# Patient Record
Sex: Male | Born: 1940 | Race: White | Hispanic: No | Smoking: Former smoker
Health system: Southern US, Community
[De-identification: ages and names within clinical notes are randomized; demographics above are authoritative.]

## PROBLEM LIST (undated history)

## (undated) DIAGNOSIS — I10 Essential (primary) hypertension: Secondary | ICD-10-CM

## (undated) DIAGNOSIS — IMO0002 Reserved for concepts with insufficient information to code with codable children: Secondary | ICD-10-CM

## (undated) DIAGNOSIS — E119 Type 2 diabetes mellitus without complications: Secondary | ICD-10-CM

## (undated) DIAGNOSIS — K7460 Unspecified cirrhosis of liver: Secondary | ICD-10-CM

## (undated) DIAGNOSIS — K219 Gastro-esophageal reflux disease without esophagitis: Secondary | ICD-10-CM

## (undated) DIAGNOSIS — I2510 Atherosclerotic heart disease of native coronary artery without angina pectoris: Secondary | ICD-10-CM

## (undated) DIAGNOSIS — I480 Paroxysmal atrial fibrillation: Secondary | ICD-10-CM

## (undated) DIAGNOSIS — H8109 Meniere's disease, unspecified ear: Secondary | ICD-10-CM

## (undated) DIAGNOSIS — E785 Hyperlipidemia, unspecified: Secondary | ICD-10-CM

## (undated) HISTORY — DX: Meniere's disease, unspecified ear: H81.09

## (undated) HISTORY — DX: Atherosclerotic heart disease of native coronary artery without angina pectoris: I25.10

## (undated) HISTORY — DX: Paroxysmal atrial fibrillation: I48.0

## (undated) HISTORY — DX: Hyperlipidemia, unspecified: E78.5

## (undated) HISTORY — PX: OTHER SURGICAL HISTORY: SHX169

## (undated) HISTORY — DX: Gastro-esophageal reflux disease without esophagitis: K21.9

## (undated) HISTORY — DX: Essential (primary) hypertension: I10

## (undated) HISTORY — DX: Reserved for concepts with insufficient information to code with codable children: IMO0002

## (undated) HISTORY — DX: Type 2 diabetes mellitus without complications: E11.9

## (undated) HISTORY — DX: Unspecified cirrhosis of liver: K74.60

---

## 1999-04-29 ENCOUNTER — Ambulatory Visit (HOSPITAL_COMMUNITY): Admission: RE | Admit: 1999-04-29 | Discharge: 1999-04-29 | Payer: Self-pay | Admitting: Family Medicine

## 2003-07-31 ENCOUNTER — Encounter: Admission: RE | Admit: 2003-07-31 | Discharge: 2003-07-31 | Payer: Self-pay | Admitting: Family Medicine

## 2003-07-31 ENCOUNTER — Encounter: Payer: Self-pay | Admitting: Family Medicine

## 2005-02-24 ENCOUNTER — Ambulatory Visit (HOSPITAL_COMMUNITY): Admission: RE | Admit: 2005-02-24 | Discharge: 2005-02-24 | Payer: Self-pay | Admitting: Family Medicine

## 2005-11-10 ENCOUNTER — Encounter (INDEPENDENT_AMBULATORY_CARE_PROVIDER_SITE_OTHER): Payer: Self-pay | Admitting: *Deleted

## 2005-11-10 ENCOUNTER — Ambulatory Visit (HOSPITAL_COMMUNITY): Admission: RE | Admit: 2005-11-10 | Discharge: 2005-11-10 | Payer: Self-pay | Admitting: General Surgery

## 2007-02-24 ENCOUNTER — Ambulatory Visit: Payer: Self-pay | Admitting: Family Medicine

## 2007-03-03 ENCOUNTER — Ambulatory Visit: Payer: Self-pay | Admitting: Family Medicine

## 2007-05-03 ENCOUNTER — Ambulatory Visit: Payer: Self-pay | Admitting: Family Medicine

## 2009-12-07 HISTORY — PX: CORONARY ARTERY BYPASS GRAFT: SHX141

## 2009-12-21 ENCOUNTER — Ambulatory Visit: Payer: Self-pay | Admitting: Cardiology

## 2009-12-21 ENCOUNTER — Encounter: Payer: Self-pay | Admitting: Thoracic Surgery (Cardiothoracic Vascular Surgery)

## 2009-12-21 ENCOUNTER — Encounter: Payer: Self-pay | Admitting: Cardiology

## 2009-12-21 ENCOUNTER — Inpatient Hospital Stay (HOSPITAL_COMMUNITY): Admission: EM | Admit: 2009-12-21 | Discharge: 2009-12-31 | Payer: Self-pay | Admitting: Emergency Medicine

## 2009-12-22 ENCOUNTER — Encounter: Payer: Self-pay | Admitting: Internal Medicine

## 2009-12-23 ENCOUNTER — Encounter (INDEPENDENT_AMBULATORY_CARE_PROVIDER_SITE_OTHER): Payer: Self-pay | Admitting: Internal Medicine

## 2009-12-23 ENCOUNTER — Encounter: Payer: Self-pay | Admitting: Thoracic Surgery (Cardiothoracic Vascular Surgery)

## 2009-12-23 ENCOUNTER — Ambulatory Visit: Payer: Self-pay | Admitting: Thoracic Surgery (Cardiothoracic Vascular Surgery)

## 2009-12-23 ENCOUNTER — Encounter: Payer: Self-pay | Admitting: Cardiology

## 2009-12-24 ENCOUNTER — Encounter: Payer: Self-pay | Admitting: Cardiology

## 2009-12-24 ENCOUNTER — Encounter: Payer: Self-pay | Admitting: Thoracic Surgery (Cardiothoracic Vascular Surgery)

## 2009-12-25 ENCOUNTER — Encounter: Payer: Self-pay | Admitting: Cardiology

## 2010-01-20 ENCOUNTER — Ambulatory Visit: Payer: Self-pay | Admitting: Thoracic Surgery (Cardiothoracic Vascular Surgery)

## 2010-01-20 ENCOUNTER — Encounter
Admission: RE | Admit: 2010-01-20 | Discharge: 2010-01-20 | Payer: Self-pay | Admitting: Thoracic Surgery (Cardiothoracic Vascular Surgery)

## 2010-01-28 ENCOUNTER — Telehealth (INDEPENDENT_AMBULATORY_CARE_PROVIDER_SITE_OTHER): Payer: Self-pay | Admitting: *Deleted

## 2010-02-10 ENCOUNTER — Encounter
Admission: RE | Admit: 2010-02-10 | Discharge: 2010-02-10 | Payer: Self-pay | Admitting: Thoracic Surgery (Cardiothoracic Vascular Surgery)

## 2010-02-10 ENCOUNTER — Encounter: Payer: Self-pay | Admitting: Cardiology

## 2010-02-10 ENCOUNTER — Ambulatory Visit: Payer: Self-pay | Admitting: Thoracic Surgery (Cardiothoracic Vascular Surgery)

## 2010-02-14 DIAGNOSIS — K7460 Unspecified cirrhosis of liver: Secondary | ICD-10-CM | POA: Insufficient documentation

## 2010-02-14 DIAGNOSIS — I10 Essential (primary) hypertension: Secondary | ICD-10-CM

## 2010-02-14 DIAGNOSIS — E119 Type 2 diabetes mellitus without complications: Secondary | ICD-10-CM

## 2010-02-14 DIAGNOSIS — I2510 Atherosclerotic heart disease of native coronary artery without angina pectoris: Secondary | ICD-10-CM | POA: Insufficient documentation

## 2010-02-17 ENCOUNTER — Ambulatory Visit: Payer: Self-pay | Admitting: Cardiology

## 2010-02-17 DIAGNOSIS — I4891 Unspecified atrial fibrillation: Secondary | ICD-10-CM | POA: Insufficient documentation

## 2010-02-17 DIAGNOSIS — D509 Iron deficiency anemia, unspecified: Secondary | ICD-10-CM

## 2010-03-11 ENCOUNTER — Encounter (INDEPENDENT_AMBULATORY_CARE_PROVIDER_SITE_OTHER): Payer: Self-pay | Admitting: *Deleted

## 2010-03-28 ENCOUNTER — Encounter: Payer: Self-pay | Admitting: Cardiology

## 2010-04-02 ENCOUNTER — Encounter: Payer: Self-pay | Admitting: Cardiology

## 2010-10-01 ENCOUNTER — Encounter: Payer: Self-pay | Admitting: Cardiology

## 2010-10-13 ENCOUNTER — Ambulatory Visit: Payer: Self-pay | Admitting: Cardiology

## 2010-10-27 ENCOUNTER — Telehealth (INDEPENDENT_AMBULATORY_CARE_PROVIDER_SITE_OTHER): Payer: Self-pay | Admitting: *Deleted

## 2010-11-06 ENCOUNTER — Encounter (INDEPENDENT_AMBULATORY_CARE_PROVIDER_SITE_OTHER): Payer: Self-pay | Admitting: *Deleted

## 2010-12-22 ENCOUNTER — Ambulatory Visit
Admission: RE | Admit: 2010-12-22 | Discharge: 2010-12-22 | Payer: Self-pay | Source: Home / Self Care | Attending: Gastroenterology | Admitting: Gastroenterology

## 2010-12-22 ENCOUNTER — Other Ambulatory Visit: Payer: Self-pay | Admitting: Gastroenterology

## 2010-12-22 LAB — CBC WITH DIFFERENTIAL/PLATELET
Basophils Absolute: 0 10*3/uL (ref 0.0–0.1)
Basophils Relative: 0.2 % (ref 0.0–3.0)
Eosinophils Absolute: 0.1 10*3/uL (ref 0.0–0.7)
Eosinophils Relative: 2.1 % (ref 0.0–5.0)
HCT: 40.4 % (ref 39.0–52.0)
Hemoglobin: 13.8 g/dL (ref 13.0–17.0)
Lymphocytes Relative: 35 % (ref 12.0–46.0)
Lymphs Abs: 1.9 10*3/uL (ref 0.7–4.0)
MCHC: 34.2 g/dL (ref 30.0–36.0)
MCV: 98.7 fl (ref 78.0–100.0)
Monocytes Absolute: 0.4 10*3/uL (ref 0.1–1.0)
Monocytes Relative: 7 % (ref 3.0–12.0)
Neutro Abs: 3 10*3/uL (ref 1.4–7.7)
Neutrophils Relative %: 55.7 % (ref 43.0–77.0)
Platelets: 154 10*3/uL (ref 150.0–400.0)
RBC: 4.1 Mil/uL — ABNORMAL LOW (ref 4.22–5.81)
RDW: 14.2 % (ref 11.5–14.6)
WBC: 5.4 10*3/uL (ref 4.5–10.5)

## 2010-12-22 LAB — COMPREHENSIVE METABOLIC PANEL
ALT: 11 U/L (ref 0–53)
AST: 21 U/L (ref 0–37)
Albumin: 3.7 g/dL (ref 3.5–5.2)
Alkaline Phosphatase: 63 U/L (ref 39–117)
BUN: 17 mg/dL (ref 6–23)
CO2: 26 mEq/L (ref 19–32)
Calcium: 9.5 mg/dL (ref 8.4–10.5)
Chloride: 104 mEq/L (ref 96–112)
Creatinine, Ser: 0.8 mg/dL (ref 0.4–1.5)
GFR: 109.01 mL/min (ref >60.00)
Glucose, Bld: 229 mg/dL — ABNORMAL HIGH (ref 70–99)
Potassium: 4.8 mEq/L (ref 3.5–5.1)
Sodium: 139 mEq/L (ref 135–145)
Total Bilirubin: 1.6 mg/dL — ABNORMAL HIGH (ref 0.3–1.2)
Total Protein: 6.5 g/dL (ref 6.0–8.3)

## 2010-12-22 LAB — APTT: aPTT: 30.8 s — ABNORMAL HIGH (ref 21.7–28.8)

## 2010-12-22 LAB — PROTIME-INR
INR: 1 ratio (ref 0.8–1.0)
Prothrombin Time: 11.3 s (ref 10.2–12.4)

## 2011-01-08 NOTE — Letter (Signed)
Summary: Generic Engineer, agricultural at [facility] [address]   [place], [place] [address]   Phone: [phone]  Fax: [phone]        March 11, 2010 MRN: [id number]    [facility] [address] [place], [place]  [address]    Dear Anthony Page,  We ordered lab work following your office visit on March 14th. It does not appear this has been done yet. Please, take the enclosed order to the [facility] as soon as possible to have your lab work done. Do not eat or drink after midnight.  If your primary MD completed this lab work for you or you do not plan to have it done, please contact our office so that we can properly document this in your chart.     Sincerely,  Cyril Loosen, RN, BSN  This letter has been electronically signed by your physician.

## 2011-01-08 NOTE — Letter (Signed)
Summary: New Patient letter  [facility]  [address] [place], [place] [address]   Phone: [phone]  Fax: [phone]       11/06/2010 MRN: [id number]  [facility] [address] [place], [place]  [address]  Dear Mr. Weichel,  Welcome to the Gastroenterology Division at [facility].    You are scheduled to see Dr. Arlyce Dice on 12/22/2010 at 10:00AM on the 3rd floor at [facility], [address]  We ask that you try to arrive at our office 15 minutes prior to your appointment time to allow for check-in.  We would like you to complete the enclosed self-administered evaluation form prior to your visit and bring it with you on the day of your appointment.  We will review it with you.  Also, please bring a complete list of all your medications or, if you prefer, bring the medication bottles and we will list them.  Please bring your insurance card so that we may make a copy of it.  If your insurance requires a referral to see a specialist, please bring your referral form from your primary care physician.  Co-payments are due at the time of your visit and may be paid by cash, check or credit card.     Your office visit will consist of a consult with your physician (includes a physical exam), any laboratory testing he/she may order, scheduling of any necessary diagnostic testing (e.g. x-ray, ultrasound, CT-scan), and scheduling of a procedure (e.g. Endoscopy, Colonoscopy) if required.  Please allow enough time on your schedule to allow for any/all of these possibilities.    If you cannot keep your appointment, please call [phone] to cancel or reschedule prior to your appointment date.  This allows [place] the opportunity to schedule an appointment for another patient in need of care.  If you do not cancel or reschedule by 5 p.m. the business day prior to your appointment date, you will be charged a $50.00 late cancellation/no-show fee.    Thank you for choosing [facility] for your medical needs.  We appreciate the opportunity to care for you.  Please visit [place] at our website  to learn more about our practice.                     Sincerely,                                                             The Gastroenterology Division

## 2011-01-08 NOTE — Letter (Signed)
Summary: Discharge Summary  Discharge Summary   Imported By: Dorise Hiss 02/14/2010 15:28:58  _____________________________________________________________________  External Attachment:    Type:   Image     Comment:   External Document

## 2011-01-08 NOTE — Assessment & Plan Note (Signed)
Summary: POST CABG PER DR. HENDRICKSON-JM   Visit Type:  hospital follow-up Primary Provider:  Nyland  CC:  hospital follow-up visit.  History of Present Illness: The patient is a 70 year old male with a recent history of coronary bypass grafting secondary to severe triple vessel coronary artery disease. The patient had postoperative atrial fibrillation and anemia. He also has a history of cirrhosis and is status post portacaval shunt in 1990 doesn't history of diabetes mellitus and dyslipidemia. The patient underwent coronary bypass grafting x4 with a left internal mammary artery to the LAD, a saphenous vein graft to the first diagonal, saphenous vein graft to obtuse marginal and a saphenous vein graft to the distal right coronary artery.  The patient presents for postoperative followup. He has already seen Dr. Dorris Fetch. He denies any sexual chest pain shortness of breath orthopnea PND. He has no palpitations or syncope. He does report some lower extremity edema.the patient reports an atypical chest pain as well in the right arm. He reports no dizziness or weakness.  Preventive Screening-Counseling & Management  Alcohol-Tobacco     Smoking Status: quit     Year Quit: 1990  Current Medications (verified): 1)  Metoprolol Tartrate 25 Mg Tabs (Metoprolol Tartrate) .... Take 1 Tablet By Mouth Two Times A Day 2)  Folic Acid 1 Mg Tabs (Folic Acid) .... Take 1 Tablet By Mouth Once A Day 3)  Klor-Con M20 20 Meq Cr-Tabs (Potassium Chloride Crys Cr) .... Take 1 Tablet By Mouth Once A Day 4)  Furosemide 40 Mg Tabs (Furosemide) .... Take 1 Tablet By Mouth Once A Day 5)  Actoplus Met 15-850 Mg Tabs (Pioglitazone Hcl-Metformin Hcl) .... Take 1 Tablet By Mouth Three Times A Day 6)  Glipizide Xl 5 Mg Xr24h-Tab (Glipizide) .... Take 1 Tablet By Mouth Once A Day 7)  Metformin Hcl 500 Mg Xr24h-Tab (Metformin Hcl) .... Take 1 Tablet By Mouth Once A Day 8)  Omeprazole 20 Mg Cpdr (Omeprazole) .... Take 1  Tablet By Mouth Once A Day 9)  Drisdol [address] Unit Caps (Ergocalciferol) .... Take One By Mouth Weekly 10)  Lisinopril 10 Mg Tabs (Lisinopril) .... Take 1 Tablet By Mouth Once A Day  Allergies (verified): 1)  ! Codeine 2)  ! * Pencillin  Comments:  Nurse/Medical Assistant: The patient's medications and allergies were reviewed with the patient and were updated in the Medication and Allergy Lists. Bottles reviewed.  Past History:  Family History: Last updated: 02/14/2010 family history is significant for diabetes  Social History: Last updated: 02/14/2010 Disabled  Married  wife has Alzheimer's and he is caregiver no alcohol no tobacco  Risk Factors: Smoking Status: quit (02/17/2010)  Past Medical History:  1. Severe coronary artery disease, now status post surgical       revascularization as described.   2. Postoperative thrombocytopenia.   3. Postoperative acute blood loss anemia.   4. History of cirrhosis.   5. Postoperative hyperbilirubinemia.   6. Postoperative delirium with elevated ammonia level.   7. History of portacaval shunt history.   8. History of postoperative paroxysmal atrial fibrillation and sinus       tachycardia, resolved.   9. History of gastroesophageal reflux.   10.History of hypertension, history of diabetes mellitus type 2.   11.Questionable history of dyslipidemia.   12.History of gastroesophageal reflux.   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   14.History of severe fractures of the feet and back from a traumatic       fall  with long-term disabilities.      G E R D Hypertension Adult onset non insulin dependent diabetes ? Dyslipidemia Meniere disease partial deafness  Past Surgical History: CABG  Coronary artery bypass grafting x4.  The following grafts were placed.   1. Left internal mammary artery to the left anterior descending.   2. Saphenous vein graft to the first diagonal.   3. Saphenous vein graft to the  obtuse marginal one.   4. Saphenous vein graft to the distal right coronary artery.    Cardiac Catheterization Liver cirrhosis with portial hypertension status post portacaval shunt  Left inguinal hernia.  Social History: Smoking Status:  quit  Vital Signs:  Patient profile:   70 year old male Height:      63 inches Weight:      184 pounds BMI:     32.71 Pulse rate:   65 / minute BP sitting:   115 / 75  (left arm) Cuff size:   regular  Vitals Entered By: Carlye Grippe (February 17, 2010 9:33 AM)  Nutrition Counseling: Patient's BMI is greater than 25 and therefore counseled on weight management options. CC: hospital follow-up visit   Physical Exam  Additional Exam:  General: Well-developed, well-nourished in no distress head: Normocephalic and atraumatic eyes PERRLA/EOMI intact, conjunctiva and lids normal nose: No deformity or lesions mouth normal dentition, normal posterior pharynx neck: Supple, no JVD.  No masses, thyromegaly or abnormal cervical nodes lungs: Normal breath sounds bilaterally without wheezing.  Normal percussion Chest: Well-healed sternotomy scar heart: regular rate and rhythm with normal S1 and S2, no S3 or S4.  PMI is normal.  No pathological murmurs abdomen: Normal bowel sounds, abdomen is soft and nontender without masses, organomegaly or hernias noted.  No hepatosplenomegaly musculoskeletal: Back normal, normal gait muscle strength and tone normal pulsus: Pulse is normal in all 4 extremities Extremities:2+ peripheral pitting edema neurologic: Alert and oriented x 3 skin: Intact without lesions or rashes cervical nodes: No significant adenopathy psychologic: Normal affect    EKG  Procedure date:  02/17/2010  Findings:      normal sinus rhythm with PACs. Heart rate 67 beats per minute  Impression & Recommendations:  Problem # 1:  CORONARY ATHEROSCLEROSIS NATIVE CORONARY ARTERY (ICD-414.01) the patient is status post recent coronary bypass  grafting. The patient reports no complications. The following medications were removed from the medication list:    Aspir-trin 325 Mg Tbec (Aspirin) .Marland Kitchen... Take 1 tablet by mouth once a day His updated medication list for this problem includes:    Metoprolol Tartrate 25 Mg Tabs (Metoprolol tartrate) .Marland Kitchen... Take 1 tablet by mouth two times a day    Lisinopril 10 Mg Tabs (Lisinopril) .Marland Kitchen... Take 1 tablet by mouth once a day  Orders: T-CBC No Diff ([id number]) T-Comprehensive Metabolic Panel ([id number]) T-BNP  (B Natriuretic Peptide) ([address]-13086) T-TSH ([address]-96295)  Problem # 2:  CIRRHOSIS (ICD-571.5) the patient's history of portacaval shunt. He has no evidence of encephalopathy.  Problem # 3:  HYPERTENSION (ICD-401.9)  The following medications were removed from the medication list:    Aspir-trin 325 Mg Tbec (Aspirin) .Marland Kitchen... Take 1 tablet by mouth once a day His updated medication list for this problem includes:    Metoprolol Tartrate 25 Mg Tabs (Metoprolol tartrate) .Marland Kitchen... Take 1 tablet by mouth two times a day    Furosemide 40 Mg Tabs (Furosemide) .Marland Kitchen... Take 1 tablet by mouth once a day    Lisinopril 10 Mg Tabs (Lisinopril) .Marland Kitchen... Take 1 tablet  by mouth once a day  Orders: T-CBC No Diff ([id number]) T-Comprehensive Metabolic Panel ([id number]) T-BNP  (B Natriuretic Peptide) (84696-29528) T-TSH (41324-40102)  Problem # 4:  DM (ICD-250.00)  The following medications were removed from the medication list:    Aspir-trin 325 Mg Tbec (Aspirin) .Marland Kitchen... Take 1 tablet by mouth once a day His updated medication list for this problem includes:    Actoplus Met 15-850 Mg Tabs (Pioglitazone hcl-metformin hcl) .Marland Kitchen... Take 1 tablet by mouth three times a day    Glipizide Xl 5 Mg Xr24h-tab (Glipizide) .Marland Kitchen... Take 1 tablet by mouth once a day    Metformin Hcl 500 Mg Xr24h-tab (Metformin hcl) .Marland Kitchen... Take 1 tablet by mouth once a day    Lisinopril 10 Mg Tabs (Lisinopril) .Marland Kitchen... Take 1 tablet by mouth  once a day  Problem # 5:  ANEMIA, IRON DEFICIENCY (ICD-280.9) blood work will be ordered to make sure the patient is not anemic.  Problem # 6:  ATRIAL FIBRILLATION (ICD-427.31) the patient has no evidence of recurrent atrial. fibrillation postoperatively.twelve-lead echocardiogram demonstrates normal sinus rhythm. The following medications were removed from the medication list:    Aspir-trin 325 Mg Tbec (Aspirin) .Marland Kitchen... Take 1 tablet by mouth once a day His updated medication list for this problem includes:    Metoprolol Tartrate 25 Mg Tabs (Metoprolol tartrate) .Marland Kitchen... Take 1 tablet by mouth two times a day  Patient Instructions: 1)  Labs 2)  Follow up in  6 months

## 2011-01-08 NOTE — Progress Notes (Signed)
Summary: Office Visit/ TCT OFFICE VISIT  Office Visit/ TCT OFFICE VISIT   Imported By: Dorise Hiss 02/14/2010 15:25:20  _____________________________________________________________________  External Attachment:    Type:   Image     Comment:   External Document

## 2011-01-08 NOTE — Progress Notes (Signed)
Summary: need refills on medications  Phone Note From Pharmacy Call back at [phone]   Caller: [facility] Call For: nurse  Summary of Call: need refills metoprolol tar 25mg  two times a day,furosemide 40mg  Take 1 tablet by mouth once a day,klor-con 20 meq Take 1 tablet by mouth once a day,folic acid 1mg  Take 1 tablet by mouth once a day.       Initial call taken by: Carlye Grippe,  January 28, 2010 4:37 PM    New/Updated Medications: METOPROLOL TARTRATE 25 MG TABS (METOPROLOL TARTRATE) Take 1 tablet by mouth two times a day FOLIC ACID 1 MG TABS (FOLIC ACID) Take 1 tablet by mouth once a day KLOR-CON M20 20 MEQ CR-TABS (POTASSIUM CHLORIDE CRYS CR) Take 1 tablet by mouth once a day FUROSEMIDE 40 MG TABS (FUROSEMIDE) Take 1 tablet by mouth once a day ASPIR-TRIN 325 MG TBEC (ASPIRIN) Take 1 tablet by mouth once a day BISACODYL EC 5 MG TBEC (BISACODYL) Take 2 tablet by mouth once a day GENEXA LA 30-400 MG XR12H-CAP (PHENYLEPHRINE-GUAIFENESIN) Take 1 tablet by mouth two times a day as needed LACTULOSE 10 GM/15ML SOLN (LACTULOSE) 15ml by mouth daily PHILLIPS MILK OF MAGNESIA 7.75 % SUSP (MAGNESIUM HYDROXIDE) 30ml by mouth daily OXYCODONE HCL 5 MG TABS (OXYCODONE HCL) take one by mouth every 4-6 hours as needed VITAMIN B-1 100 MG TABS (THIAMINE HCL) Take 1 tablet by mouth once a day ACTOPLUS MET 15-850 MG TABS (PIOGLITAZONE HCL-METFORMIN HCL) Take 1 tablet by mouth once a day GLIPIZIDE XL 5 MG XR24H-TAB (GLIPIZIDE) Take 1 tablet by mouth once a day METFORMIN HCL 500 MG XR24H-TAB (METFORMIN HCL) Take 1 tablet by mouth once a day OMEPRAZOLE 20 MG CPDR (OMEPRAZOLE) Take 1 tablet by mouth once a day DRISDOL 45409 UNIT CAPS (ERGOCALCIFEROL) take one by mouth weekly

## 2011-01-08 NOTE — Op Note (Signed)
Summary: Operative Report  Operative Report   Imported By: Dorise Hiss 02/14/2010 15:30:56  _____________________________________________________________________  External Attachment:    Type:   Image     Comment:   External Document

## 2011-01-08 NOTE — Assessment & Plan Note (Signed)
Summary: 6 MO  FU PER SPET REMINDER   Visit Type:  Follow-up Primary Provider:  Nyland   History of Present Illness: the patient is a 70 year old male with a history of cirrhosis, history of coronary bypass grafting and postoperative atrial  fibrillation. The patient also has a history of anemia and is status post portacaval shunt, diabetes mellitus and dyslipidemia. Recent laboratory work demonstrated a total bilirubin of 2.1. Liver panel is within normal limits. The patient does not smoke or drink anymore. Used to drink half a gallon of bourbon a day. He denies any chest pain shortness of breath orthopnea PND the patient had a problem with lower extremity edema but this has resolved after discontinuing Actos.He is otherwise stable from a CV perspective.   Preventive Screening-Counseling & Management  Alcohol-Tobacco     Smoking Status: quit     Year Quit: 1990  Current Medications (verified): 1)  Metoprolol Tartrate 25 Mg Tabs (Metoprolol Tartrate) .... Take 1 Tablet By Mouth Two Times A Day 2)  Folic Acid 1 Mg Tabs (Folic Acid) .... Take 1 Tablet By Mouth Once A Day 3)  Klor-Con M20 20 Meq Cr-Tabs (Potassium Chloride Crys Cr) .... Take 1 Tablet By Mouth Once A Day 4)  Furosemide 40 Mg Tabs (Furosemide) .... Take 1 Tablet By Mouth Once A Day 5)  Metformin Hcl 500 Mg Tabs (Metformin Hcl) .... Take 2 Tablet By Mouth Two Times A Day 6)  Glipizide Xl 5 Mg Xr24h-Tab (Glipizide) .... Take 1 Tablet By Mouth Once A Day 7)  Prilosec Otc 20 Mg Tbec (Omeprazole Magnesium) .... Take 1 Tablet By Mouth Once A Day 8)  Lisinopril 10 Mg Tabs (Lisinopril) .... Take 1 Tablet By Mouth Once A Day 9)  Hydrocodone-Acetaminophen 5-500 Mg Tabs (Hydrocodone-Acetaminophen) .... Take 1 Tablet By Mouth Once A Day  Allergies (verified): 1)  ! Codeine 2)  ! * Pencillin  Past History:  Past Surgical History: Last updated: 02/17/2010 CABG  Coronary artery bypass grafting x4.  The following grafts were placed.     1. Left internal mammary artery to the left anterior descending.   2. Saphenous vein graft to the first diagonal.   3. Saphenous vein graft to the obtuse marginal one.   4. Saphenous vein graft to the distal right coronary artery.    Cardiac Catheterization Liver cirrhosis with portial hypertension status post portacaval shunt  Left inguinal hernia.  Family History: Last updated: 02/14/2010 family history is significant for diabetes  Social History: Last updated: 02/14/2010 Disabled  Married  wife has Alzheimer's and he is caregiver no alcohol no tobacco  Risk Factors: Smoking Status: quit (10/13/2010)  Past Medical History: Reviewed history from 02/17/2010 and no changes required.  1. Severe coronary artery disease, now status post surgical       revascularization as described.   2. Postoperative thrombocytopenia.   3. Postoperative acute blood loss anemia.   4. History of cirrhosis.   5. Postoperative hyperbilirubinemia.   6. Postoperative delirium with elevated ammonia level.   7. History of portacaval shunt history.   8. History of postoperative paroxysmal atrial fibrillation and sinus       tachycardia, resolved.   9. History of gastroesophageal reflux.   10.History of hypertension, history of diabetes mellitus type 2.   11.Questionable history of dyslipidemia.   12.History of gastroesophageal reflux.   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   14.History of severe fractures of the feet and back from a traumatic  fall with long-term disabilities.      G E R D Hypertension Adult onset non insulin dependent diabetes ? Dyslipidemia Meniere disease partial deafness  Review of Systems       The patient complains of fatigue, muscle weakness, and joint pain.  The patient denies malaise, fever, weight gain/loss, vision loss, decreased hearing, hoarseness, chest pain, palpitations, shortness of breath, prolonged cough, wheezing, sleep  apnea, coughing up blood, abdominal pain, blood in stool, nausea, vomiting, diarrhea, heartburn, incontinence, blood in urine, leg swelling, rash, skin lesions, headache, fainting, dizziness, depression, anxiety, enlarged lymph nodes, easy bruising or bleeding, and environmental allergies.    Vital Signs:  Patient profile:   70 year old male Height:      63 inches Weight:      187 pounds Pulse rate:   60 / minute BP sitting:   117 / 70  (left arm) Cuff size:   large  Vitals Entered By: Carlye Grippe (October 13, 2010 2:04 PM)  Physical Exam  Additional Exam:  General: Well-developed, well-nourished in no distress head: Normocephalic and atraumatic eyes PERRLA/EOMI intact, conjunctiva and lids normal nose: No deformity or lesions mouth normal dentition, normal posterior pharynx neck: Supple, no JVD.  No masses, thyromegaly or abnormal cervical nodes lungs: Normal breath sounds bilaterally without wheezing.  Normal percussion Chest: Well-healed sternotomy scar heart: regular rate and rhythm with normal S1 and S2, no S3 or S4.  PMI is normal.  No pathological murmurs abdomen: Normal bowel sounds, abdomen is soft and nontender without masses, organomegaly or hernias noted.  No hepatosplenomegaly musculoskeletal: Back normal, normal gait muscle strength and tone normal pulsus: Pulse is normal in all 4 extremities Extremities:2+ peripheral pitting edema neurologic: Alert and oriented x 3 skin: Intact without lesions or rashes cervical nodes: No significant adenopathy psychologic: Normal affect    Impression & Recommendations:  Problem # 1:  ATRIAL FIBRILLATION (ICD-427.31) no recurrence of a fibrillation the patient remains in normal sinus rhythm His updated medication list for this problem includes:    Metoprolol Tartrate 25 Mg Tabs (Metoprolol tartrate) .Marland Kitchen... Take 1 tablet by mouth two times a day  Problem # 2:  ANEMIA, IRON DEFICIENCY (ICD-280.9) laboratory work within  normal limits.  Problem # 3:  CIRRHOSIS (ICD-571.5) the patient has stopped drinking his bilirubin was 2.1 but he does not appear to be jaundiced. transaminases are wnl. Patient will be referred to Dr. Vincent Peyer.   Problem # 4:  HYPERTENSION (ICD-401.9) blood pressure well controlled. His updated medication list for this problem includes:    Metoprolol Tartrate 25 Mg Tabs (Metoprolol tartrate) .Marland Kitchen... Take 1 tablet by mouth two times a day    Furosemide 40 Mg Tabs (Furosemide) .Marland Kitchen... Take 1 tablet by mouth once a day    Lisinopril 10 Mg Tabs (Lisinopril) .Marland Kitchen... Take 1 tablet by mouth once a day  Problem # 5:  CORONARY ATHEROSCLEROSIS NATIVE CORONARY ARTERY (ICD-414.01) the patient status post coronary bypass grafting as no recurrent chest pain. Continue current medical regimen. His lipid panel is at goal. His updated medication list for this problem includes:    Metoprolol Tartrate 25 Mg Tabs (Metoprolol tartrate) .Marland Kitchen... Take 1 tablet by mouth two times a day    Lisinopril 10 Mg Tabs (Lisinopril) .Marland Kitchen... Take 1 tablet by mouth once a day  Patient Instructions: 1)  Stop Zantac 2)  Begin Prilosec 20mg  daily 3)  Referral to Dr. Cathlean Cower in [place] (GI) 4)  Follow up in  6 months Prescriptions:  PRILOSEC OTC 20 MG TBEC (OMEPRAZOLE MAGNESIUM) Take 1 tablet by mouth once a day  #30 x 6   Entered by:   Hoover Brunette, LPN   Authorized by:   Lewayne Bunting, MD, [facility]   Signed by:   Hoover Brunette, LPN on 16/09/9603   Method used:   Electronically to        [facility] [phone]* (retail)       [address] [place], [place]  [address]       Ph: [phone] or [phone]       Fax: [phone]   RxID:   [id number]

## 2011-01-08 NOTE — Progress Notes (Signed)
Summary: GI referral for cirrhosis   ---- Converted from flag ---- ---- 10/13/2010 2:48 PM, Hoover Brunette, LPN wrote: Referral to Dr. Cathlean Cower in [place] (GI)  - cirrhosis ------------------------------  Phone Note Other Incoming   Summary of Call: Dr. Matthias Hughs is not accepting any new patients.  Can't locate a Dr. Cathlean Cower in [place].  Busy number above to notify pt.  Hoover Brunette, LPN  October 27, 2010 3:23 PM    Follow-up for Phone Call        refer to Dr. Julio Alm Follow-up by: Lewayne Bunting, MD, [facility],  October 31, 2010 2:17 PM  Additional Follow-up for Phone Call Additional follow up Details #1::        Spoke with patient.  He is okay to see Dr. Dickie La.   Hoover Brunette, LPN  November 06, 2010 10:25 AM     Additional Follow-up for Phone Call Additional follow up Details #2::    Appointment scheduled for:    Monday, January 16 at 10:00  with Dr. Melvia Heaps [facility])  Nothing available with Dr. Julio Alm.  Will send info in mail to patient at his request.   Hoover Brunette, LPN  November 06, 2010 10:38 AM

## 2011-01-08 NOTE — Consult Note (Signed)
Summary: Consultation Report  Consultation Report   Imported By: Dorise Hiss 02/14/2010 15:34:32  _____________________________________________________________________  External Attachment:    Type:   Image     Comment:   External Document

## 2011-01-08 NOTE — Assessment & Plan Note (Signed)
Summary: liver cirosis...as.   History of Present Illness Visit Type: Initial Consult Primary GI MD: Melvia Heaps MD [facility] Primary Provider: Joette Catching, MD Requesting Provider: Lewayne Bunting, MD Chief Complaint: Patient states that he has a liver shunt. He also has GI hx with Dr. Vincent Peyer. He is here to follow up on liver, he denies any problems at the current time. He has been under alot of stress taking care of his wife and she recently passed away. Patient states since his liver surgery his breast have grown and he is concerned as to why, History of Present Illness:   Anthony Page is a 70 year old white male referred at the request of Drs. Nyland and Degent for evaluation of liver disease.  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  He has been alcohol-free since that time and generally has done well from a GI standpoint.  He has coronary artery disease and status post CABG.  His only complaint is gynecomastia.   GI Review of Systems      Denies abdominal pain, acid reflux, belching, bloating, chest pain, dysphagia with liquids, dysphagia with solids, heartburn, loss of appetite, nausea, vomiting, vomiting blood, weight loss, and  weight gain.      Reports liver problems.     Denies anal fissure, black tarry stools, change in bowel habit, constipation, diarrhea, diverticulosis, fecal incontinence, heme positive stool, hemorrhoids, irritable bowel syndrome, jaundice, light color stool, rectal bleeding, and  rectal pain. Preventive Screening-Counseling & Management      Drug Use:  no.      Current Medications (verified): 1)  Metoprolol Tartrate 25 Mg Tabs (Metoprolol Tartrate) .... Take 1 Tablet By Mouth Two Times A Day 2)  Folic Acid 1 Mg Tabs (Folic Acid) .... Take 1 Tablet By Mouth Once A Day 3)  Klor-Con M20 20 Meq Cr-Tabs (Potassium Chloride Crys Cr) .... Take 1 Tablet By Mouth Once A Day 4)  Furosemide 40 Mg Tabs (Furosemide) ....  Take 1 Tablet By Mouth Once A Day 5)  Metformin Hcl 500 Mg Tabs (Metformin Hcl) .... Take 2 Tablet By Mouth Two Times A Day 6)  Glipizide Xl 5 Mg Xr24h-Tab (Glipizide) .... Take 1 Tablet By Mouth Once A Day 7)  Prilosec Otc 20 Mg Tbec (Omeprazole Magnesium) .... Take 1 Tablet By Mouth Once A Day 8)  Lisinopril 10 Mg Tabs (Lisinopril) .... Take 1 Tablet By Mouth Once A Day 9)  Aleve 220 Mg Tabs (Naproxen Sodium) .... Take One By Mouth As Needed  Allergies (verified): 1)  ! Codeine 2)  ! * Pencillin  Past History:  Past Medical History: Reviewed history from 02/17/2010 and no changes required.  1. Severe coronary artery disease, now status post surgical       revascularization as described.   2. Postoperative thrombocytopenia.   3. Postoperative acute blood loss anemia.   4. History of cirrhosis.   5. Postoperative hyperbilirubinemia.   6. Postoperative delirium with elevated ammonia level.   7. History of portacaval shunt history.   8. History of postoperative paroxysmal atrial fibrillation and sinus       tachycardia, resolved.   9. History of gastroesophageal reflux.   10.History of hypertension, history of diabetes mellitus type 2.   11.Questionable history of dyslipidemia.   12.History of gastroesophageal reflux.   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   14.History of severe fractures of the feet and back from a traumatic  fall with long-term disabilities.      G E R D Hypertension Adult onset non insulin dependent diabetes ? Dyslipidemia Meniere disease partial deafness  Past Surgical History: Reviewed history from 02/17/2010 and no changes required. CABG  Coronary artery bypass grafting x4.  The following grafts were placed.   1. Left internal mammary artery to the left anterior descending.   2. Saphenous vein graft to the first diagonal.   3. Saphenous vein graft to the obtuse marginal one.   4. Saphenous vein graft to the distal right  coronary artery.    Cardiac Catheterization Liver cirrhosis with portial hypertension status post portacaval shunt  Left inguinal hernia.  Family History: family history is significant for diabetes: brother, son Brother had cancer ?type Sister had cancer "in her famale organs" Family History of Heart Disease: brother  Social History: Disabled  widowed wife has Alzheimer's deceased no alcohol no tobacco Illicit Drug Use - no Daily Caffeine Use 3 cups coffee per day Drug Use:  no  Review of Systems       The patient complains of back pain, fatigue, hearing problems, and swelling of feet/legs.  The patient denies allergy/sinus, anemia, anxiety-new, arthritis/joint pain, blood in urine, breast changes/lumps, change in vision, confusion, cough, coughing up blood, depression-new, fainting, fever, headaches-new, heart murmur, heart rhythm changes, itching, muscle pains/cramps, night sweats, nosebleeds, shortness of breath, skin rash, sleeping problems, sore throat, swollen lymph glands, thirst - excessive, urination - excessive, urination changes/pain, urine leakage, vision changes, and voice change.         All other systems were reviewed and were negative   Vital Signs:  Patient profile:   70 year old male Height:      63 inches Weight:      188.0 pounds BMI:     33.42 Pulse rate:   72 / minute Pulse rhythm:   regular BP sitting:   122 / 72  (left arm) Cuff size:   regular  Vitals Entered By: Harlow Mares CMA Duncan Dull) (December 22, 2010 9:14 AM)  Physical Exam  Additional Exam:  on physical exam he is a well-developed well-nourished male  skin: anicteric HEENT: normocephalic; PEERLA; no nasal or pharyngeal abnormalities neck: supple nodes: no cervical lymphadenopathy chest: clear to ausculatation and percussion; there is bilateral gynecomastia heart: no murmurs, gallops, or rubs abd: soft, nontender; BS normoactive; no abdominal masses, tenderness, organomegaly rectal:  deferred ext: no cynanosis, clubbing, edema skeletal: no deformities neuro: oriented x 3; no focal abnormalities    Impression & Recommendations:  Problem # 1:  CIRRHOSIS (ICD-571.5) Status post portal caval shunt.  The patient appears to have stable liver disease without gross evidence for encephalopathy.  Gynecomastia is related to cirrhosis. Mr. Pipe has been a patient of Dr. Matthias Hughs in the past.  I have recommended that he follow up with Dr. Matthias Hughs for any further care.  Recommendations #1 check CBC, INR and comprehensive metabolic profile #2 review prior records Orders: TLB-CBC Platelet - w/Differential (85025-CBCD) TLB-CMP (Comprehensive Metabolic Pnl) (80053-COMP) TLB-PT (Protime) (85610-PTP) TLB-PTT (85730-PTTL)  Problem # 2:  CORONARY ATHEROSCLEROSIS NATIVE CORONARY ARTERY (ICD-414.01) Assessment: Comment Only  Problem # 3:  DM (ICD-250.00) Assessment: Comment Only  Patient Instructions: 1)  Copy sent to : Joette Catching, MD  Lewayne Bunting, MD, Bernette Redbird, MD 2)  You will go to the basement for labs today 3)  We will contact Dr Matthias Hughs for you about you continuing care at their office. You are established with Dr Matthias Hughs,  we have scheduled you an appointment with him for 01/23/2011 at 9:30am 4)  We will fax over your result of labs to Dr Buccinis office 5)  The medication list was reviewed and reconciled.  All changed / newly prescribed medications were explained.  A complete medication list was provided to the patient / caregiver.

## 2011-01-08 NOTE — Progress Notes (Signed)
Summary: Office Visit/ TCT OFFICE VISIT  Office Visit/ TCT OFFICE VISIT   Imported By: Dorise Hiss 02/14/2010 15:23:20  _____________________________________________________________________  External Attachment:    Type:   Image     Comment:   External Document

## 2011-01-08 NOTE — Consult Note (Signed)
Summary: Consultation Report  Consultation Report   Imported By: Dorise Hiss 02/14/2010 15:32:18  _____________________________________________________________________  External Attachment:    Type:   Image     Comment:   External Document

## 2011-01-08 NOTE — Letter (Signed)
Summary: Certified Letter Response Card  Certified Letter Response Card   Imported By: Cyril Loosen, RN, BSN 04/01/2010 13:48:02  _____________________________________________________________________  External Attachment:    Type:   Image     Comment:   External Document

## 2011-02-22 LAB — DIFFERENTIAL
Basophils Relative: 1 % (ref 0–1)
Eosinophils Absolute: 0.1 10*3/uL (ref 0.0–0.7)
Neutro Abs: 2.7 10*3/uL (ref 1.7–7.7)

## 2011-02-22 LAB — POCT CARDIAC MARKERS
CKMB, poc: <1 ng/mL — ABNORMAL LOW (ref 1.0–8.0)
Myoglobin, poc: 39.9 ng/mL (ref 12–200)
Troponin i, poc: <0.05 ng/mL (ref 0.00–0.09)

## 2011-02-22 LAB — CBC
HCT: 40.9 % (ref 39.0–52.0)
Hemoglobin: 14.4 g/dL (ref 13.0–17.0)
Platelets: 130 10*3/uL — ABNORMAL LOW (ref 150–400)
RBC: 4.21 MIL/uL — ABNORMAL LOW (ref 4.22–5.81)
RDW: 13.6 % (ref 11.5–15.5)

## 2011-02-22 LAB — CK TOTAL AND CKMB (NOT AT ARMC)
CK, MB: 0.8 ng/mL (ref 0.3–4.0)
Relative Index: INVALID (ref 0.0–2.5)
Total CK: 40 U/L (ref 7–232)

## 2011-02-22 LAB — BASIC METABOLIC PANEL
BUN: 13 mg/dL (ref 6–23)
CO2: 23 mEq/L (ref 19–32)
GFR calc Af Amer: >60 mL/min (ref >60)
Glucose, Bld: 292 mg/dL — ABNORMAL HIGH (ref 70–99)
Potassium: 4.6 mEq/L (ref 3.5–5.1)

## 2011-02-22 LAB — CARDIAC PANEL(CRET KIN+CKTOT+MB+TROPI): Relative Index: INVALID (ref 0.0–2.5)

## 2011-02-22 LAB — GLUCOSE, CAPILLARY: Glucose-Capillary: 207 mg/dL — ABNORMAL HIGH (ref 70–99)

## 2011-02-22 LAB — TROPONIN I

## 2011-02-22 LAB — HEMOGLOBIN A1C: Hgb A1c MFr Bld: 6.9 % — ABNORMAL HIGH (ref 4.6–6.1)

## 2011-02-23 LAB — POCT I-STAT 4, (NA,K, GLUC, HGB,HCT)
Glucose, Bld: 103 mg/dL — ABNORMAL HIGH (ref 70–99)
Glucose, Bld: 147 mg/dL — ABNORMAL HIGH (ref 70–99)
Glucose, Bld: 151 mg/dL — ABNORMAL HIGH (ref 70–99)
Glucose, Bld: 167 mg/dL — ABNORMAL HIGH (ref 70–99)
Glucose, Bld: 171 mg/dL — ABNORMAL HIGH (ref 70–99)
HCT: 25 % — ABNORMAL LOW (ref 39.0–52.0)
HCT: 26 % — ABNORMAL LOW (ref 39.0–52.0)
HCT: 26 % — ABNORMAL LOW (ref 39.0–52.0)
HCT: 33 % — ABNORMAL LOW (ref 39.0–52.0)
Hemoglobin: 11.2 g/dL — ABNORMAL LOW (ref 13.0–17.0)
Hemoglobin: 8.5 g/dL — ABNORMAL LOW (ref 13.0–17.0)
Potassium: 3.5 mEq/L (ref 3.5–5.1)
Potassium: 3.6 mEq/L (ref 3.5–5.1)
Potassium: 4.1 mEq/L (ref 3.5–5.1)
Potassium: 4.6 mEq/L (ref 3.5–5.1)
Potassium: 5.1 mEq/L (ref 3.5–5.1)
Sodium: 138 mEq/L (ref 135–145)
Sodium: 142 mEq/L (ref 135–145)

## 2011-02-23 LAB — CBC
HCT: 24.7 % — ABNORMAL LOW (ref 39.0–52.0)
HCT: 24.9 % — ABNORMAL LOW (ref 39.0–52.0)
HCT: 25.1 % — ABNORMAL LOW (ref 39.0–52.0)
HCT: 26.8 % — ABNORMAL LOW (ref 39.0–52.0)
HCT: 27.5 % — ABNORMAL LOW (ref 39.0–52.0)
HCT: 27.8 % — ABNORMAL LOW (ref 39.0–52.0)
HCT: 35.7 % — ABNORMAL LOW (ref 39.0–52.0)
HCT: 38.9 % — ABNORMAL LOW (ref 39.0–52.0)
Hemoglobin: 8.2 g/dL — ABNORMAL LOW (ref 13.0–17.0)
Hemoglobin: 8.6 g/dL — ABNORMAL LOW (ref 13.0–17.0)
Hemoglobin: 8.9 g/dL — ABNORMAL LOW (ref 13.0–17.0)
Hemoglobin: 9.4 g/dL — ABNORMAL LOW (ref 13.0–17.0)
Hemoglobin: 9.5 g/dL — ABNORMAL LOW (ref 13.0–17.0)
Hemoglobin: 9.7 g/dL — ABNORMAL LOW (ref 13.0–17.0)
MCHC: 34.2 g/dL (ref 30.0–36.0)
MCHC: 34.7 g/dL (ref 30.0–36.0)
MCHC: 34.8 g/dL (ref 30.0–36.0)
MCHC: 34.8 g/dL (ref 30.0–36.0)
MCHC: 35 g/dL (ref 30.0–36.0)
MCHC: 35.2 g/dL (ref 30.0–36.0)
MCHC: 35.2 g/dL (ref 30.0–36.0)
MCHC: 35.3 g/dL (ref 30.0–36.0)
MCV: 100 fL (ref 78.0–100.0)
MCV: 100.3 fL — ABNORMAL HIGH (ref 78.0–100.0)
MCV: 95.9 fL (ref 78.0–100.0)
MCV: 96.7 fL (ref 78.0–100.0)
MCV: 97.3 fL (ref 78.0–100.0)
MCV: 97.5 fL (ref 78.0–100.0)
MCV: 97.9 fL (ref 78.0–100.0)
MCV: 98.3 fL (ref 78.0–100.0)
MCV: 99 fL (ref 78.0–100.0)
Platelets: 107 10*3/uL — ABNORMAL LOW (ref 150–400)
Platelets: 124 10*3/uL — ABNORMAL LOW (ref 150–400)
Platelets: 137 10*3/uL — ABNORMAL LOW (ref 150–400)
Platelets: 137 10*3/uL — ABNORMAL LOW (ref 150–400)
Platelets: 70 10*3/uL — ABNORMAL LOW (ref 150–400)
Platelets: 80 10*3/uL — ABNORMAL LOW (ref 150–400)
RBC: 2.31 MIL/uL — ABNORMAL LOW (ref 4.22–5.81)
RBC: 2.42 MIL/uL — ABNORMAL LOW (ref 4.22–5.81)
RBC: 2.46 MIL/uL — ABNORMAL LOW (ref 4.22–5.81)
RBC: 2.52 MIL/uL — ABNORMAL LOW (ref 4.22–5.81)
RBC: 2.68 MIL/uL — ABNORMAL LOW (ref 4.22–5.81)
RBC: 2.88 MIL/uL — ABNORMAL LOW (ref 4.22–5.81)
RDW: 13.4 % (ref 11.5–15.5)
RDW: 13.5 % (ref 11.5–15.5)
RDW: 13.9 % (ref 11.5–15.5)
RDW: 14.2 % (ref 11.5–15.5)
RDW: 14.6 % (ref 11.5–15.5)
RDW: 15 % (ref 11.5–15.5)
WBC: 5 10*3/uL (ref 4.0–10.5)
WBC: 5.8 10*3/uL (ref 4.0–10.5)
WBC: 6.9 10*3/uL (ref 4.0–10.5)
WBC: 7.6 10*3/uL (ref 4.0–10.5)
WBC: 8.5 10*3/uL (ref 4.0–10.5)
WBC: 9.3 10*3/uL (ref 4.0–10.5)

## 2011-02-23 LAB — COMPREHENSIVE METABOLIC PANEL
ALT: 18 U/L (ref 0–53)
ALT: 19 U/L (ref 0–53)
AST: 21 U/L (ref 0–37)
AST: 28 U/L (ref 0–37)
AST: 42 U/L — ABNORMAL HIGH (ref 0–37)
Albumin: 2.7 g/dL — ABNORMAL LOW (ref 3.5–5.2)
Albumin: 2.7 g/dL — ABNORMAL LOW (ref 3.5–5.2)
Albumin: 2.9 g/dL — ABNORMAL LOW (ref 3.5–5.2)
Alkaline Phosphatase: 56 U/L (ref 39–117)
Alkaline Phosphatase: 73 U/L (ref 39–117)
BUN: 13 mg/dL (ref 6–23)
BUN: 20 mg/dL (ref 6–23)
BUN: 32 mg/dL — ABNORMAL HIGH (ref 6–23)
CO2: 22 mEq/L (ref 19–32)
CO2: 23 mEq/L (ref 19–32)
CO2: 25 mEq/L (ref 19–32)
Calcium: 8.1 mg/dL — ABNORMAL LOW (ref 8.4–10.5)
Calcium: 8.3 mg/dL — ABNORMAL LOW (ref 8.4–10.5)
Calcium: 8.5 mg/dL (ref 8.4–10.5)
Chloride: 108 mEq/L (ref 96–112)
Chloride: 108 mEq/L (ref 96–112)
Chloride: 115 mEq/L — ABNORMAL HIGH (ref 96–112)
Creatinine, Ser: 0.86 mg/dL (ref 0.4–1.5)
Creatinine, Ser: 0.86 mg/dL (ref 0.4–1.5)
Creatinine, Ser: 0.9 mg/dL (ref 0.4–1.5)
GFR calc Af Amer: >60 mL/min (ref >60)
GFR calc Af Amer: >60 mL/min (ref >60)
GFR calc Af Amer: >60 mL/min (ref >60)
GFR calc non Af Amer: >60 mL/min (ref >60)
GFR calc non Af Amer: >60 mL/min (ref >60)
GFR calc non Af Amer: >60 mL/min (ref >60)
GFR calc non Af Amer: >60 mL/min (ref >60)
GFR calc non Af Amer: >60 mL/min (ref >60)
Glucose, Bld: 101 mg/dL — ABNORMAL HIGH (ref 70–99)
Glucose, Bld: 92 mg/dL (ref 70–99)
Potassium: 3.9 mEq/L (ref 3.5–5.1)
Potassium: 4 mEq/L (ref 3.5–5.1)
Sodium: 139 mEq/L (ref 135–145)
Sodium: 139 mEq/L (ref 135–145)
Total Bilirubin: 1.4 mg/dL — ABNORMAL HIGH (ref 0.3–1.2)
Total Bilirubin: 2.1 mg/dL — ABNORMAL HIGH (ref 0.3–1.2)
Total Bilirubin: 3.1 mg/dL — ABNORMAL HIGH (ref 0.3–1.2)
Total Protein: 5.3 g/dL — ABNORMAL LOW (ref 6.0–8.3)
Total Protein: 5.5 g/dL — ABNORMAL LOW (ref 6.0–8.3)

## 2011-02-23 LAB — BASIC METABOLIC PANEL
BUN: 15 mg/dL (ref 6–23)
BUN: 20 mg/dL (ref 6–23)
BUN: 23 mg/dL (ref 6–23)
CO2: 19 mEq/L (ref 19–32)
CO2: 24 mEq/L (ref 19–32)
CO2: 25 mEq/L (ref 19–32)
Calcium: 8 mg/dL — ABNORMAL LOW (ref 8.4–10.5)
Calcium: 8 mg/dL — ABNORMAL LOW (ref 8.4–10.5)
Chloride: 112 mEq/L (ref 96–112)
Chloride: 113 mEq/L — ABNORMAL HIGH (ref 96–112)
Creatinine, Ser: 0.87 mg/dL (ref 0.4–1.5)
Creatinine, Ser: 0.9 mg/dL (ref 0.4–1.5)
GFR calc Af Amer: >60 mL/min (ref >60)
GFR calc non Af Amer: >60 mL/min (ref >60)
GFR calc non Af Amer: >60 mL/min (ref >60)
Glucose, Bld: 119 mg/dL — ABNORMAL HIGH (ref 70–99)
Glucose, Bld: 228 mg/dL — ABNORMAL HIGH (ref 70–99)
Potassium: 3.9 mEq/L (ref 3.5–5.1)
Potassium: 4 mEq/L (ref 3.5–5.1)
Sodium: 142 mEq/L (ref 135–145)

## 2011-02-23 LAB — GLUCOSE, CAPILLARY
Glucose-Capillary: 104 mg/dL — ABNORMAL HIGH (ref 70–99)
Glucose-Capillary: 107 mg/dL — ABNORMAL HIGH (ref 70–99)
Glucose-Capillary: 122 mg/dL — ABNORMAL HIGH (ref 70–99)
Glucose-Capillary: 137 mg/dL — ABNORMAL HIGH (ref 70–99)
Glucose-Capillary: 140 mg/dL — ABNORMAL HIGH (ref 70–99)
Glucose-Capillary: 147 mg/dL — ABNORMAL HIGH (ref 70–99)
Glucose-Capillary: 149 mg/dL — ABNORMAL HIGH (ref 70–99)
Glucose-Capillary: 166 mg/dL — ABNORMAL HIGH (ref 70–99)
Glucose-Capillary: 170 mg/dL — ABNORMAL HIGH (ref 70–99)
Glucose-Capillary: 171 mg/dL — ABNORMAL HIGH (ref 70–99)
Glucose-Capillary: 173 mg/dL — ABNORMAL HIGH (ref 70–99)
Glucose-Capillary: 175 mg/dL — ABNORMAL HIGH (ref 70–99)
Glucose-Capillary: 176 mg/dL — ABNORMAL HIGH (ref 70–99)
Glucose-Capillary: 176 mg/dL — ABNORMAL HIGH (ref 70–99)
Glucose-Capillary: 179 mg/dL — ABNORMAL HIGH (ref 70–99)
Glucose-Capillary: 194 mg/dL — ABNORMAL HIGH (ref 70–99)
Glucose-Capillary: 198 mg/dL — ABNORMAL HIGH (ref 70–99)
Glucose-Capillary: 199 mg/dL — ABNORMAL HIGH (ref 70–99)
Glucose-Capillary: 230 mg/dL — ABNORMAL HIGH (ref 70–99)
Glucose-Capillary: 57 mg/dL — ABNORMAL LOW (ref 70–99)
Glucose-Capillary: 69 mg/dL — ABNORMAL LOW (ref 70–99)
Glucose-Capillary: 84 mg/dL (ref 70–99)
Glucose-Capillary: 85 mg/dL (ref 70–99)
Glucose-Capillary: 87 mg/dL (ref 70–99)
Glucose-Capillary: 94 mg/dL (ref 70–99)

## 2011-02-23 LAB — MAGNESIUM
Magnesium: 1.8 mg/dL (ref 1.5–2.5)
Magnesium: 2.3 mg/dL (ref 1.5–2.5)

## 2011-02-23 LAB — POCT I-STAT 3, ART BLOOD GAS (G3+)
Acid-base deficit: 2 mmol/L (ref 0.0–2.0)
Acid-base deficit: 6 mmol/L — ABNORMAL HIGH (ref 0.0–2.0)
Bicarbonate: 17.5 mEq/L — ABNORMAL LOW (ref 20.0–24.0)
Bicarbonate: 19.7 mEq/L — ABNORMAL LOW (ref 20.0–24.0)
Bicarbonate: 22.1 mEq/L (ref 20.0–24.0)
O2 Saturation: 97 %
O2 Saturation: 99 %
Patient temperature: 35.6
Patient temperature: 37.4
TCO2: 19 mmol/L (ref 0–100)
TCO2: 21 mmol/L (ref 0–100)
pCO2 arterial: 26.2 mmHg — ABNORMAL LOW (ref 35.0–45.0)
pCO2 arterial: 33.2 mmHg — ABNORMAL LOW (ref 35.0–45.0)
pH, Arterial: 7.381 (ref 7.350–7.450)
pH, Arterial: 7.425 (ref 7.350–7.450)
pH, Arterial: 7.448 (ref 7.350–7.450)

## 2011-02-23 LAB — TYPE AND SCREEN: Antibody Screen: NEGATIVE

## 2011-02-23 LAB — CARDIAC PANEL(CRET KIN+CKTOT+MB+TROPI): Troponin I: 0.02 ng/mL (ref 0.00–0.06)

## 2011-02-23 LAB — DIFFERENTIAL
Basophils Absolute: 0 10*3/uL (ref 0.0–0.1)
Lymphocytes Relative: 44 % (ref 12–46)
Monocytes Absolute: 0.4 10*3/uL (ref 0.1–1.0)
Neutro Abs: 2.2 10*3/uL (ref 1.7–7.7)

## 2011-02-23 LAB — APTT: aPTT: 41 seconds — ABNORMAL HIGH (ref 24–37)

## 2011-02-23 LAB — AMMONIA
Ammonia: 57 umol/L — ABNORMAL HIGH (ref 11–35)
Ammonia: 64 umol/L — ABNORMAL HIGH (ref 11–35)

## 2011-02-23 LAB — POCT I-STAT, CHEM 8
BUN: 12 mg/dL (ref 6–23)
BUN: 25 mg/dL — ABNORMAL HIGH (ref 6–23)
Chloride: 112 mEq/L (ref 96–112)
Chloride: 114 mEq/L — ABNORMAL HIGH (ref 96–112)
Creatinine, Ser: 0.6 mg/dL (ref 0.4–1.5)
Creatinine, Ser: 1 mg/dL (ref 0.4–1.5)
Glucose, Bld: 155 mg/dL — ABNORMAL HIGH (ref 70–99)
Hemoglobin: 9.2 g/dL — ABNORMAL LOW (ref 13.0–17.0)
Potassium: 4 mEq/L (ref 3.5–5.1)
Potassium: 4.5 mEq/L (ref 3.5–5.1)
Sodium: 141 mEq/L (ref 135–145)
Sodium: 142 mEq/L (ref 135–145)

## 2011-02-23 LAB — ABO/RH: ABO/RH(D): O NEG

## 2011-02-23 LAB — CREATININE, SERUM: Creatinine, Ser: 1.24 mg/dL (ref 0.4–1.5)

## 2011-02-23 LAB — LIPID PANEL
HDL: 33 mg/dL — ABNORMAL LOW (ref >39)
Total CHOL/HDL Ratio: 4.4 RATIO

## 2011-02-23 LAB — MRSA PCR SCREENING: MRSA by PCR: NEGATIVE

## 2011-02-23 LAB — HEPATIC FUNCTION PANEL
ALT: 16 U/L (ref 0–53)
AST: 47 U/L — ABNORMAL HIGH (ref 0–37)
Total Protein: 4.4 g/dL — ABNORMAL LOW (ref 6.0–8.3)

## 2011-04-21 ENCOUNTER — Other Ambulatory Visit: Payer: Self-pay | Admitting: *Deleted

## 2011-04-21 ENCOUNTER — Other Ambulatory Visit: Payer: Self-pay | Admitting: Cardiology

## 2011-04-21 MED ORDER — POTASSIUM CHLORIDE CRYS ER 20 MEQ PO TBCR: 20.0000 meq | EXTENDED_RELEASE_TABLET | Freq: Every day | ORAL | Status: DC

## 2011-04-21 MED ORDER — FOLIC ACID 1 MG PO TABS: 1.0000 mg | ORAL_TABLET | Freq: Every day | ORAL | Status: DC

## 2011-04-21 NOTE — Assessment & Plan Note (Signed)
OFFICE VISIT   Anthony Page, Anthony Page  DOB:  [birth year]                                        January 20, 2010  CHART #:  [id number]   REASON FOR VISIT:  Follow up after recent coronary bypass grafting.   HISTORY OF PRESENT ILLNESS:  The patient is a 70 year old gentleman who  is status post coronary bypass grafting x4 on December 24, 2009.  Postoperatively, he had a difficult time.  He had some thrombocytopenia  and anemia.  His bilirubin was elevated.  He has a history of cirrhosis  and a portacaval shunt, and his ammonia was elevated.  Ultimately, he  was discharged home.  He is now staying with his grandson.  He says he  has been walking quite a bit.  He is on home O2.  He has been using it  around the clock.  He has been off it for about 2-1/2 hours now.   CURRENT MEDICATIONS:  1. Aspirin 325 mg daily.  2. Bisacodyl 10 mg daily.  3. Colace 100 mg b.i.d.  4. Folic acid a milligram daily.  5. Lactulose 10 g daily.  6. Lopressor 25 mg b.i.d.  7. Thiamine 10 mg daily.  8. Actos plus met 15/850 one daily.  9. Glipizide XL 5 mg daily.  10.Metformin ER 500 mg at bedtime.  11.Omeprazole 20 mg daily.  12.Vitamin D.  13.Oxycodone.  14.Lisinopril 10 mg daily.  15.Lasix 40 mg daily.  16.Potassium 20 mEq daily.   ALLERGIES:  He has allergies to codeine and penicillin.   PHYSICAL EXAMINATION:  The patient is a 70 year old gentleman in no  acute distress.  His blood pressure is 109/68, pulse 76, respirations  16, his oxygen saturation is 94%, that is on room air.  Lungs have  diminished breath sounds at the left base.  Sternum is stable.  Sternal  incision is clean, dry, and intact.  Cardiac exam has regular rate and  rhythm.  Normal S1 and S2.  No rubs, murmurs, or gallops.  He has trace  edema in the right leg.  His wounds are well healed.   DIAGNOSTIC TESTS:  Chest x-ray shows a large left pleural effusion.   IMPRESSION:  The patient is a 70 year old  gentleman who underwent high-  risk coronary bypass grafting about a month ago.  He is really doing  quite well all things considered.  He went home on oxygen, I doubt if he  needs that.  I am going to change him to just using it at nights and on  a p.r.n. basis when he is exercising and then hopefully we can wean him  off that altogether after doing a thoracentesis.  He does have a rather  large pleural effusion.  We are going to do a thoracentesis in the  office.  The indications, risks, benefits, and alternatives were  discussed with the patient.  He also has been having some urinary  frequency in relatively small amounts and possibly some right flank pain  last night, although he says he is not having that today.  We will plan  to do a thoracentesis in the office.  We will send him for an urinalysis  and urine culture as well as help him get an appointment set up with his  cardiologist.   PROCEDURE NOTE:  Using sterile  technique and 1% lidocaine local  anesthetic, left thoracentesis was performed, approximately 850 mL of  clear serous fluid was evacuated.  The patient did have some discomfort  and coughing towards the end of the procedure but overall tolerated it  well.   The patient will be sent for a chest x-ray now.  I will plan to see him  back in 3 weeks with a repeat chest x-ray, then we will also check an  urinalysis.  He needs an appointment with his cardiologist as well.   Salvatore Decent Dorris Fetch, M.D.  Electronically Signed   SCH/MEDQ  D:  01/20/2010  T:  01/21/2010  Job:  [id number]   cc:   Verne Carrow, MD  Delaney Meigs, M.D.

## 2011-04-21 NOTE — Assessment & Plan Note (Signed)
OFFICE VISIT   Anthony Page, Anthony Page  DOB:  [birth year]                                        February 10, 2010  CHART #:  [id number]   HISTORY:  The patient comes in today for a 3-week followup.  He was  recently seen by Dr. Dorris Fetch in postoperative followup status post  coronary artery bypass grafting x4, which was performed on December 24, 2009.  At that time, he had been on home O2 and was having some  shortness of breath.  He was noted on chest x-ray to have a large left  pleural effusion and underwent a left thoracentesis in the office  removing approximately 850 mL of serous fluid.  Since his last visit,  the patient reports that his breathing has been stable and he is walking  further and further distances without any shortness of breath at all.  He denies any chest pain or lower extremity edema.  He continues to  improve overall.  He continues to use his oxygen at night, but only as  needed.  According to his grandson who accompanies him to the office  today, he has not used in the last several days.  He has an appointment  in the next few weeks to see Dr. Diona Browner.  He has no complaints today.   PHYSICAL EXAMINATION:  Vital Signs:  Blood pressure is 110/73, pulse is  74, respirations 18, and O2 sat 98%.  His sternal chest tube and right  lower extremity EVH incisions have all healed well.  Heart:  Regular  rate and rhythm without murmurs, rubs, or gallops.  Chest:  Sternum is  stable to palpation.  Lungs:  Clear.  Extremities:  No significant  edema.   Chest x-ray shows a small residual left pleural effusion, but this is  stable from his post-thoracentesis film.   ASSESSMENT/PLAN:  The patient continues to progress, status post  coronary artery bypass graft.  His left effusion is resolving and he is  currently asymptomatic.  He is not requiring home O2 on a regular basis  and I feel at this point that he has not used it regularly for several  days and  his sats have remained 98% on room air that he may discontinue  use altogether.  He should keep his appointment in the next few weeks  with Dr. Diona Browner for review of his medications.  He is encouraged to  continue walking regularly and to pursue cardiac rehab phase II.  He may  begin driving and increasing his activity levels including lifting.  We  will see him back hereafter on a p.r.n. basis and he is asked to call if  he experiences any problems which he feels are related to his surgery.   Salvatore Decent Dorris Fetch, M.D.  Electronically Signed   GC/MEDQ  D:  02/10/2010  T:  02/11/2010  Job:  [id number]   cc:   Delaney Meigs, M.D.  Jonelle Sidle, MD  [facility] Office

## 2011-04-24 NOTE — Op Note (Signed)
NAME:  Anthony Page, Anthony Page NO.:  [id number]   MEDICAL RECORD NO.:  [id number]          PATIENT TYPE:  AMB   LOCATION:  DAY                          FACILITY:  [facility]   PHYSICIAN:  Sharlet Salina T. Hoxworth, M.D.DATE OF BIRTH:  02/01/1941   DATE OF PROCEDURE:  11/10/2005  DATE OF DISCHARGE:                                 OPERATIVE REPORT   PREOPERATIVE DIAGNOSIS:  Left inguinal hernia.   POSTOPERATIVE DIAGNOSIS:  Left inguinal hernia.   SURGICAL PROCEDURE:  Repair of left inguinal hernia.   SURGEON:  Dr. Johna Sheriff   ANESTHESIA:  Local with IV sedation.   BRIEF HISTORY:  Mr. Laredo is a 70 year old male, who presents with a painful  bulge in his left groin, and exam confirms a tender left inguinal hernia.  Options for repair have been discussed, and we have elected to proceed with  open repair with mesh under local anesthesia with sedation.  The nature of  the procedure; indications; risks of bleeding, infection, and recurrence  were discussed understood.  He is now brought to the operating room for this  procedure.   DESCRIPTION OF OPERATION:  The patient brought to the operating room and  placed in supine position on the operating table, and IV sedation was  administered.  Local anesthesia was used to block the ilioinguinal nerve and  infiltrate the area of the incision in the left groin.  An oblique incision  was made and dissection carried down through subcutaneous tissue.  Crossing  veins were doubly clamped, divided, and ligated with 3-0 Vicryl.  The  external oblique was identified, anesthetized, and divided along the lines  of its fibers through the external ring.  The cord was identified, and there  was a large indirect sac traveling with the cord down into the scrotum. The  cord and sac were dissected up off the floor with the pubic tubercle, and  cremasteric fibers were divided bilaterally up to the internal ring,  mobilizing the cord and the sac.  There was  a lot of scarring between the  sac and the cord with the sac fairly thickened.  Using blunt sharp and  cautery dissection, the sac was dissected away from cord structures up to  the level of the internal ring.  The sac was opened, and there was no  sliding component.  There was some ascites.  The sac was suture-ligated at  the internal ring, divided, and removed.  A piece of Parietex mesh was then  trimmed to size to fit the floor of the inguinal canal with tails to go  around the cord at the internal ring.  The mesh was sutured initially to the  pubic tubercle and then to the inguinal ligament with running 2-0 Prolene,  working medial to lateral, out to lateral to the internal ring.  Medially,  the mesh was sutured to the edge of the rectus sheath with interrupted 2-0  Prolene.  Tails were tied then together lateral to the cord, creating a new  internal ring snugged with fingertip.  This provided nice broad coverage of  the  direct and indirect spaces.  The cord was returned to its anatomic  position.  Hemostasis was assured.  The external oblique was closed with  running 3-0 Vicryl.  Scarpa's fascia was closed with  running 2-0 Vicryl and the skin closed with running subcuticular 4-0  Monocryl and Steri-Strips.  Sponge, needle, and instrument counts were  correct. Dry sterile dressings were applied and the patient taken to  recovery in good condition.      Lorne Skeens. Hoxworth, M.D.  Electronically Signed     BTH/MEDQ  D:  11/10/2005  T:  11/10/2005  Job:  [id number]

## 2011-05-18 ENCOUNTER — Other Ambulatory Visit: Payer: Self-pay | Admitting: Cardiology

## 2011-05-21 ENCOUNTER — Encounter: Payer: Self-pay | Admitting: Cardiology

## 2011-06-29 ENCOUNTER — Ambulatory Visit (INDEPENDENT_AMBULATORY_CARE_PROVIDER_SITE_OTHER): Payer: Medicare Other | Admitting: Cardiology

## 2011-06-29 ENCOUNTER — Encounter: Payer: Self-pay | Admitting: Cardiology

## 2011-06-29 VITALS — BP 108/68 | HR 60 | Resp 17 | Ht 64.0 in | Wt 181.4 lb

## 2011-06-29 DIAGNOSIS — I2510 Atherosclerotic heart disease of native coronary artery without angina pectoris: Secondary | ICD-10-CM

## 2011-06-29 DIAGNOSIS — I4891 Unspecified atrial fibrillation: Secondary | ICD-10-CM

## 2011-06-29 DIAGNOSIS — I10 Essential (primary) hypertension: Secondary | ICD-10-CM

## 2011-06-29 NOTE — Assessment & Plan Note (Signed)
No recurrent chest pain. Continue medical therapy. No indication for stress testing.

## 2011-06-29 NOTE — Progress Notes (Signed)
HPI The patient is a 70 year old male with a history of cirrhosis and portacaval shunt. The patient is a widow and has gone through significant familial stress. He has a son and 2 daughters that died his wife also died and he also has a granddaughter all who died within the last 5 years. Is followed closely by gastroenterology. The cirrhosis appears to be stable and had no recent GI bleeding. He also has a history of coronary bypass grafting postoperative atrial fibrillation. He has diabetes mellitus and dyslipidemia. He has developed gynecomastia as a complication of his cirrhosis. From a cardiac standpoint however he stable. He denies any chest pain shortness of breath orthopnea PND. He has no palpitations or syncope.  Allergies  Allergen Reactions  . Codeine   . Penicillins     Current Outpatient Prescriptions on File Prior to Visit  Medication Sig Dispense Refill  . folic acid (FOLVITE) 1 MG tablet Take 1 tablet (1 mg total) by mouth daily.  30 tablet  6  . furosemide (LASIX) 40 MG tablet Take 40 tablets by mouth Daily.      Marland Kitchen GLIPIZIDE XL 5 MG 24 hr tablet Take 5 tablets by mouth Daily.      Marland Kitchen lisinopril (PRINIVIL,ZESTRIL) 10 MG tablet Take 10 tablets by mouth Daily.      . metFORMIN (GLUCOPHAGE-XR) 500 MG 24 hr tablet Take 1,000 tablets by mouth Twice daily.      . metoprolol tartrate (LOPRESSOR) 25 MG tablet TAKE ONE TABLET BY MOUTH TWICE DAILY  60 tablet  5  . naproxen sodium (ANAPROX) 220 MG tablet Take 220 mg by mouth daily as needed.        Marland Kitchen omeprazole (PRILOSEC) 20 MG capsule TAKE ONE CAPSULE BY MOUTH ONE TIME DAILY  30 capsule  5  . potassium chloride SA (KLOR-CON M20) 20 MEQ tablet Take 1 tablet (20 mEq total) by mouth daily.  30 tablet  6    Past Medical History  Diagnosis Date  . CAD (coronary artery disease)     severe. s/p surgical revascularization as described.  . Thrombocytopenia     postoperative  . Anemia associated with acute blood loss     postoperative  .  Cirrhosis     Hx  . Hyperbilirubinemia     postoperative  . Delirium     postoperative delirium w/elevated ammonialevels  . Hx of ventricular shunt     Hx of portacaval shunt   . Paroxysmal atrial fibrillation     Hx of postoperative paroxysmal afib and sinus tachycardia, resolved.   Marland Kitchen History of gastroesophageal reflux (GERD)   . H/O: HTN (hypertension)   . DM2 (diabetes mellitus, type 2)     Hx. non insulin dependent  . Dyslipidemia     questionable Hx  . Meniere disease     partial deafness  . Back fracture     Hx of severe fractures of the feet and back from traumatic fall w/long-term disabilites  . Dyslipidemia     Past Surgical History  Procedure Date  . Coronary artery bypass graft     x4. placed: left internal mammary artery to theleft anterior descending. saphenous vein graft to the first diagonal. saphenous vein graft to the obtuse marginal one. saphenous vein graft to the distal right coronary artery  . Cardiac catheterization   . Liver cirrhosis     with portial htn  . Portacaval shunt   . Left inguinal hernia     Family History  Problem Relation Age of Onset  . Diabetes Brother     and son  . Cancer Brother   . Cancer Sister     "in male organs"  . Heart disease Brother     History   Social History  . Marital Status: Widowed    Spouse Name: N/A    Number of Children: N/A  . Years of Education: N/A   Occupational History  . Not on file.   Social History Main Topics  . Smoking status: Unknown If Ever Smoked  . Smokeless tobacco: Not on file   Comment: tobacco use-no  . Alcohol Use: No  . Drug Use: No  . Sexually Active: Not on file   Other Topics Concern  . Not on file   Social History Narrative   Disabled. Widowed, wife had Alzheimer's. Daily caffeine use- 3 cups of coffee/day   WUJ:[id number] positives as outlined above. The remainder of the 18  point review of systems is negative  PHYSICAL EXAM BP 108/68  Pulse 60  Resp 17  Ht  5\' 4"  (1.626 m)  Wt 181 lb 6.4 oz (82.283 kg)  BMI 31.14 kg/m2  SpO2 92%  General: Well-developed, well-nourished in no distress Head: Normocephalic and atraumatic Eyes:PERRLA/EOMI intact, conjunctiva and lids normal Ears: No deformity or lesions Mouth:normal dentition, normal posterior pharynx Neck: Supple, no JVD.  No masses, thyromegaly or abnormal cervical nodes Lungs: Normal breath sounds bilaterally without wheezing.  Normal percussion Chest: Bilateral gynecomastia Cardiac: regular rate and rhythm with normal S1 and S2, no S3 or S4.  PMI is normal.  No pathological murmurs Abdomen: Normal bowel sounds, abdomen is soft and nontender without masses, organomegaly or hernias noted.  No hepatosplenomegaly MSK: Back normal, normal gait muscle strength and tone normal Vascular: Pulse is normal in all 4 extremities Extremities: Mild lower extremity edema Neurologic: Alert and oriented x 3 Skin: Intact without lesions or rashes Lymphatics: No significant adenopathy Psychologic: Normal affect   ECG: Not available  ASSESSMENT AND PLAN

## 2011-06-29 NOTE — Assessment & Plan Note (Signed)
Blood pressure well controlled. Continue medical therapy 

## 2011-06-29 NOTE — Assessment & Plan Note (Signed)
No recurrent atrial fibrillation patient remains in normal sinus rhythm. No indication for Coumadin given his history of cirrhosis and elevated PT/INR.

## 2011-06-29 NOTE — Patient Instructions (Signed)
Continue all current medications. Your physician wants you to follow up in: 6 months.  You will receive a reminder letter in the mail one-two months in advance.  If you don't receive a letter, please call our office to schedule the follow up appointment   

## 2011-08-16 IMAGING — CR DG CHEST 1V PORT
1 series · 1 of 1 positions shown · non-contrast
Comparison: 12/26/2009

CLINICAL DATA: Chest pain, angina, coronary bypass

PORTABLE CHEST - 1 VIEW

[view not recorded]
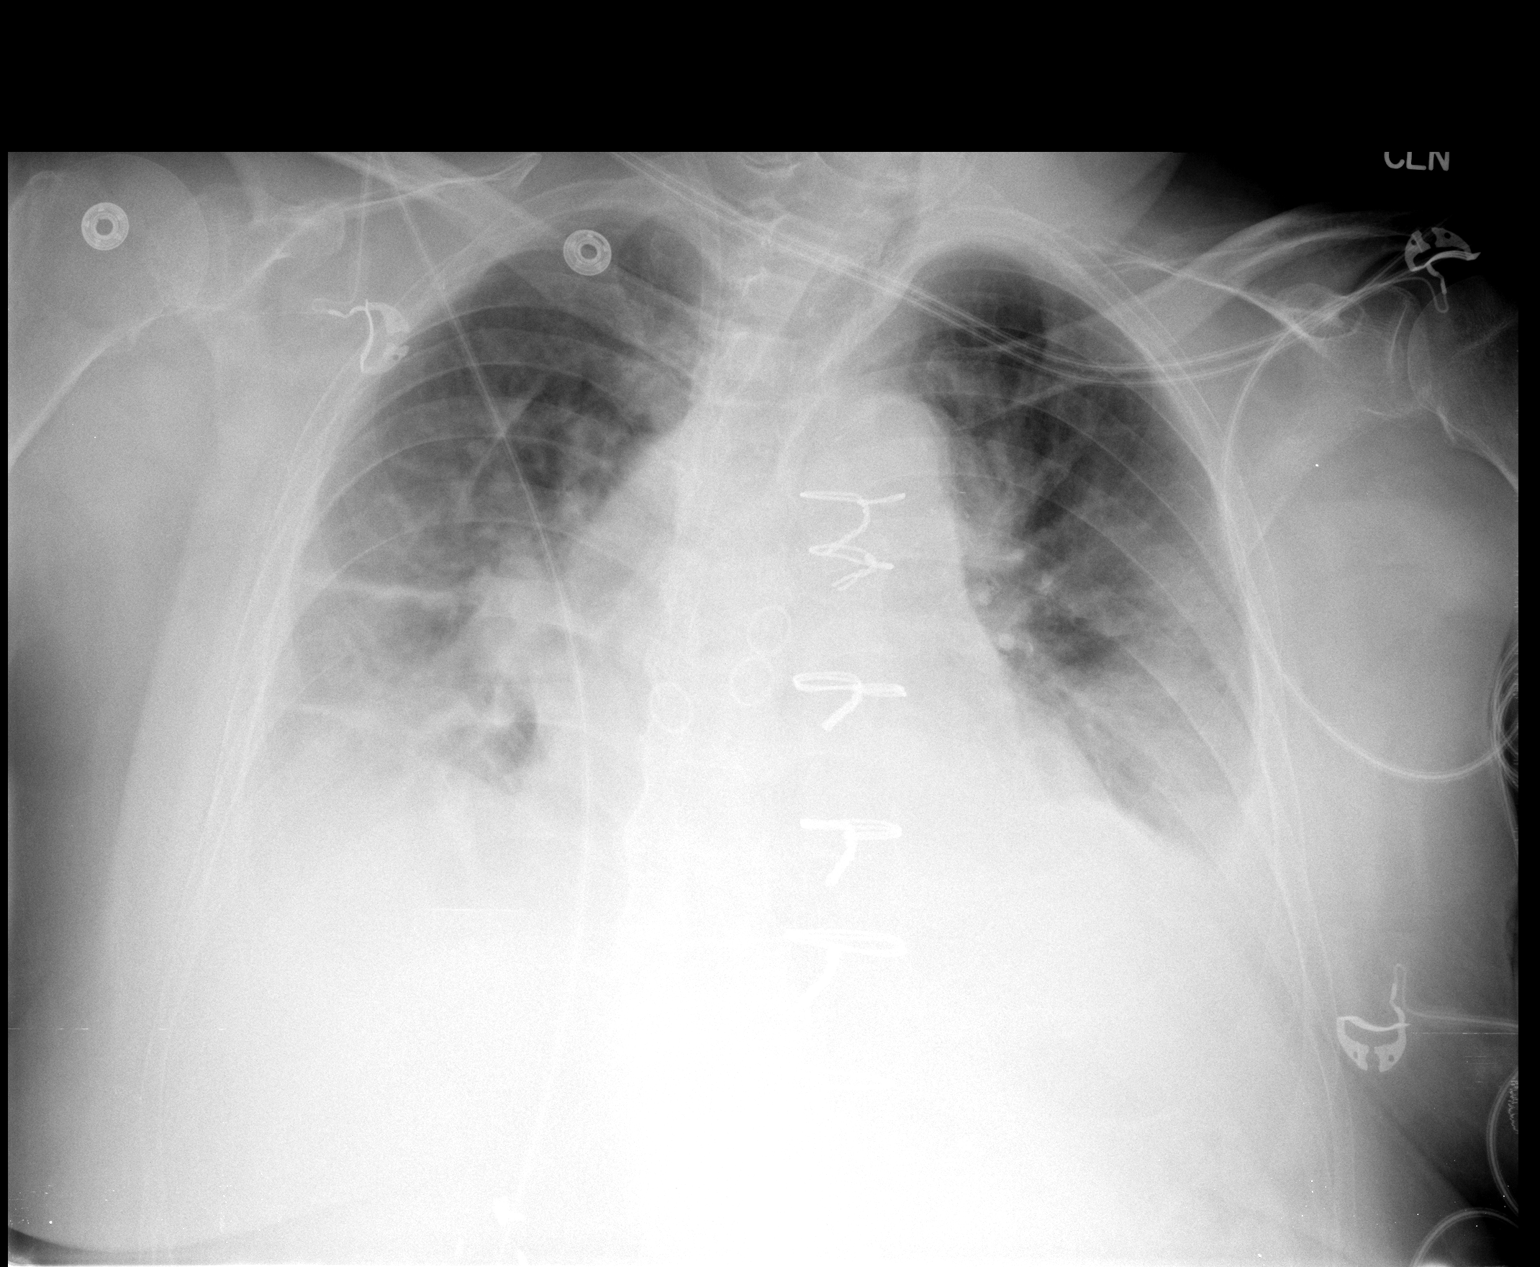

[1 of 1 positions shown; findings below may reference images not displayed]

FINDINGS: Right IJ vascular sheath remains.  Previous coronary
bypass changes noted.  Low lung volumes persist with basilar
atelectasis / consolidation and slight worsening edema pattern.
Pleural effusions are noted.  No pneumothorax.  Exam is rotated to
the left.
IMPRESSION: Slight worsening edema pattern, basilar atelectasis and small
effusions.

## 2011-08-17 IMAGING — CR DG CHEST 2V
2 series · 2 of 2 positions shown · non-contrast
Comparison: Portable chest x-rays yesterday and dating back to
12/26/2009.

CLINICAL DATA: Postop CABG.  Follow up edema and effusions.

CHEST - 2 VIEW 12/30/2009:

[w chest pa]
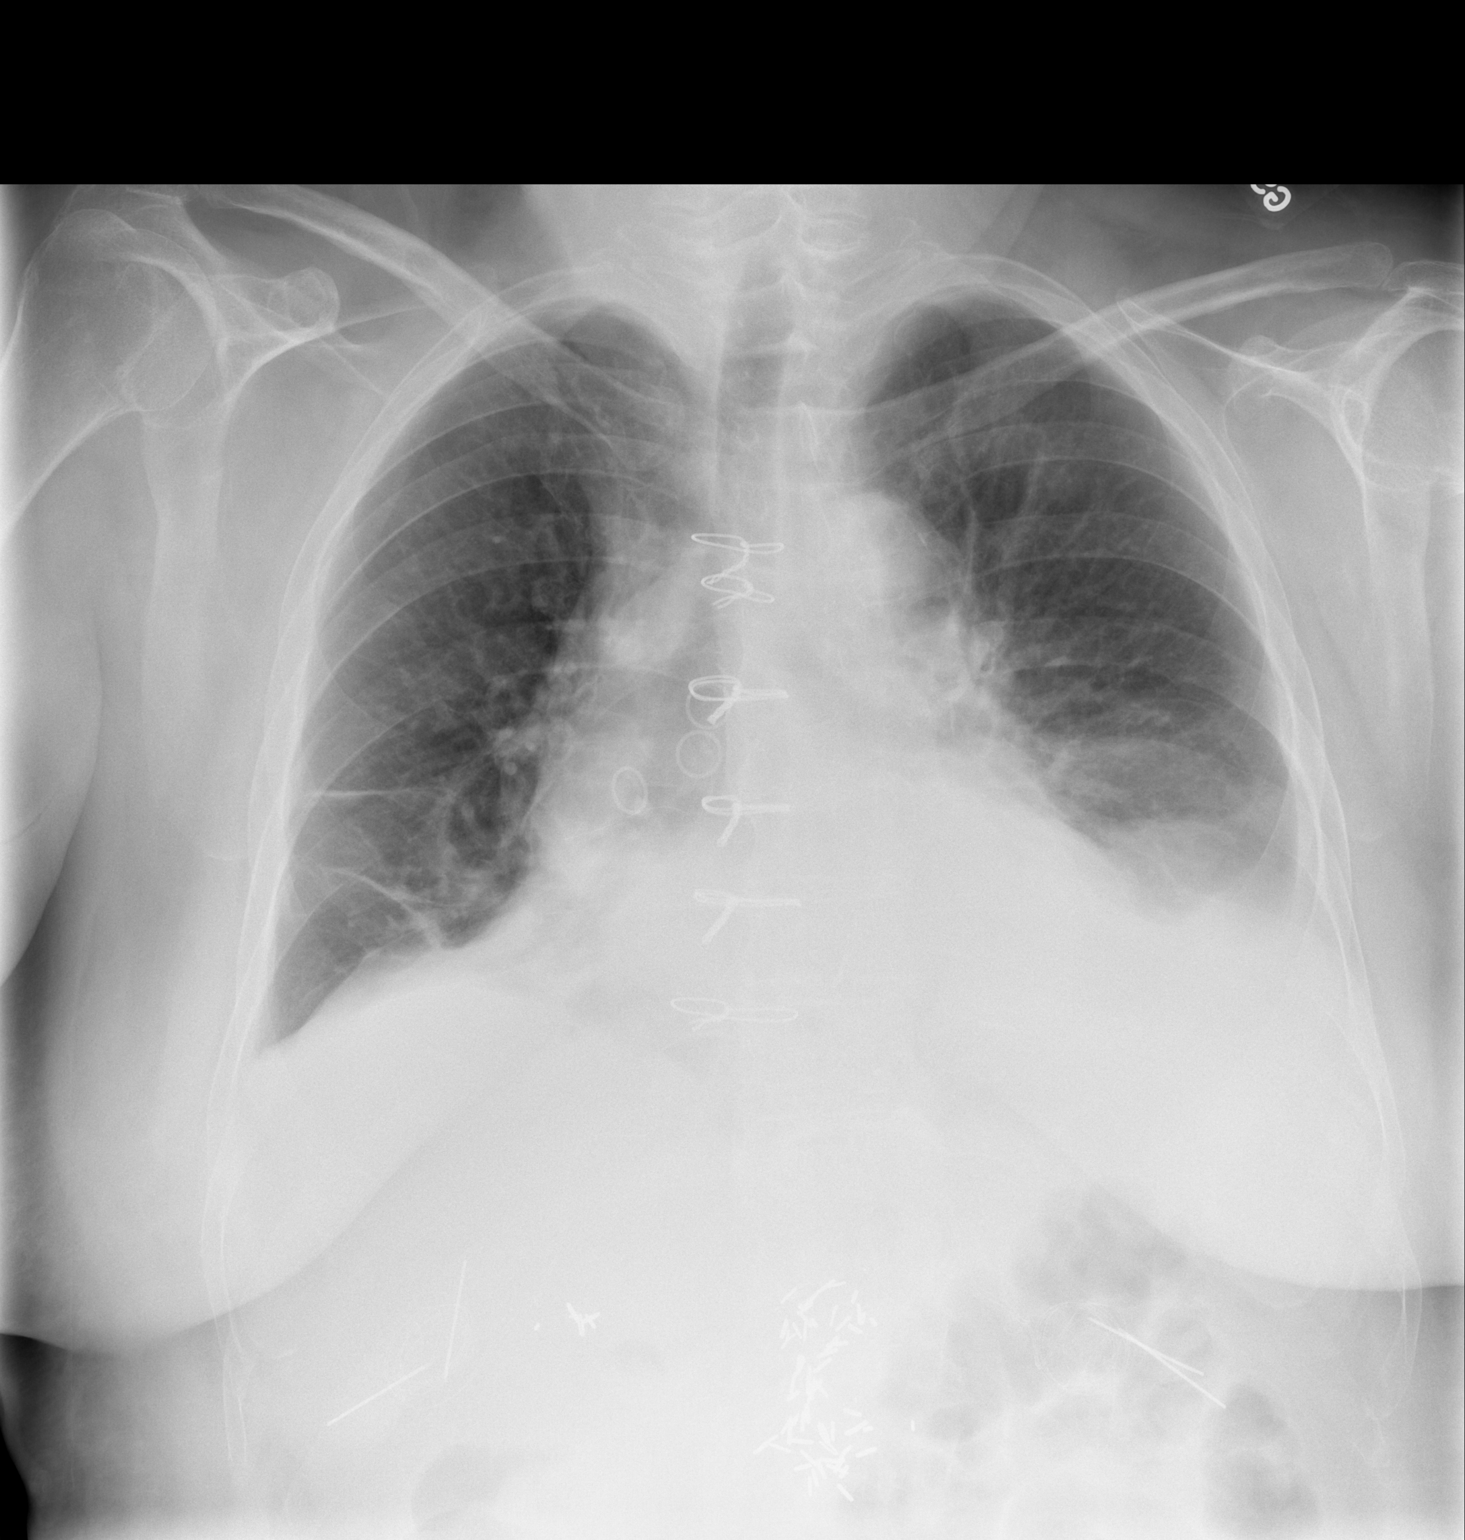

[w chest lat]
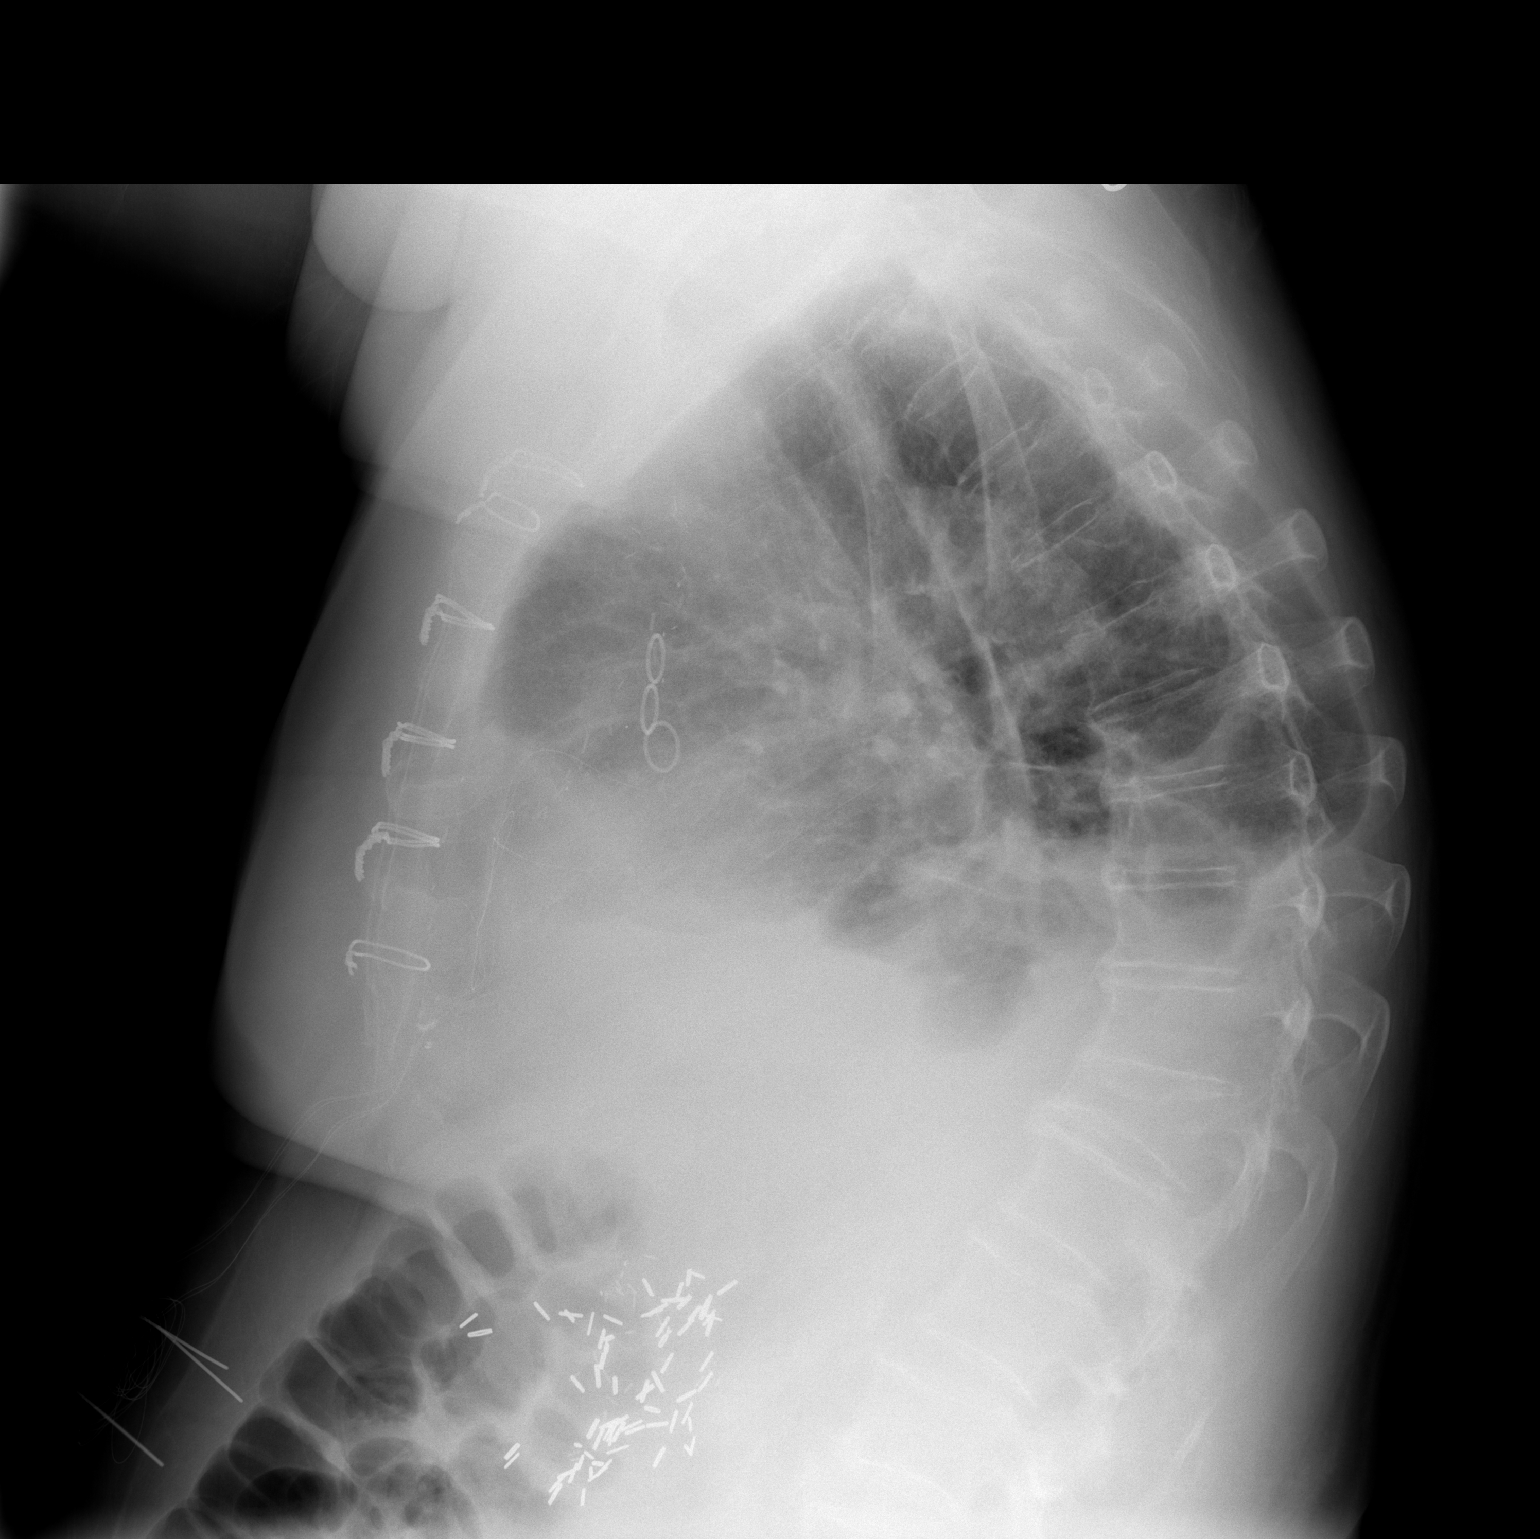

[2 of 2 positions shown; findings below may reference images not displayed]

FINDINGS: Resolution of pulmonary edema since yesterday, with
persistent pulmonary venous hypertension.  Stable large left
pleural effusion and associated consolidation in the left lower
lobe.  Improved right pleural effusion and improved aeration in the
right lower lobe.  No new pulmonary parenchymal abnormalities.
Heart enlarged but stable.  Sternotomy for CABG.
IMPRESSION: Resolution of pulmonary edema. Stable large left pleural effusion
and associated dense left lower lobe atelectasis and/or pneumonia.
Improved right pleural effusion and improved aeration in the right
lower lobe.  No new abnormalities.

## 2011-11-18 ENCOUNTER — Other Ambulatory Visit: Payer: Self-pay | Admitting: *Deleted

## 2011-11-18 MED ORDER — POTASSIUM CHLORIDE CRYS ER 20 MEQ PO TBCR: 20.0000 meq | EXTENDED_RELEASE_TABLET | Freq: Every day | ORAL | Status: DC

## 2011-11-18 MED ORDER — FOLIC ACID 1 MG PO TABS: 1.0000 mg | ORAL_TABLET | Freq: Every day | ORAL | Status: DC

## 2011-12-03 ENCOUNTER — Other Ambulatory Visit: Payer: Self-pay | Admitting: *Deleted

## 2011-12-03 MED ORDER — METOPROLOL TARTRATE 25 MG PO TABS: 25.0000 mg | ORAL_TABLET | Freq: Two times a day (BID) | ORAL | Status: DC

## 2011-12-21 ENCOUNTER — Other Ambulatory Visit: Payer: Self-pay | Admitting: *Deleted

## 2011-12-21 MED ORDER — OMEPRAZOLE 20 MG PO CPDR: 20.0000 mg | DELAYED_RELEASE_CAPSULE | Freq: Every day | ORAL | Status: DC

## 2012-02-04 ENCOUNTER — Other Ambulatory Visit: Payer: Self-pay

## 2012-02-04 MED ORDER — METOPROLOL TARTRATE 25 MG PO TABS: 25.0000 mg | ORAL_TABLET | Freq: Two times a day (BID) | ORAL | Status: DC

## 2012-02-15 ENCOUNTER — Other Ambulatory Visit: Payer: Self-pay | Admitting: *Deleted

## 2012-02-15 MED ORDER — OMEPRAZOLE 20 MG PO CPDR: 20.0000 mg | DELAYED_RELEASE_CAPSULE | Freq: Every day | ORAL | Status: DC

## 2012-02-29 ENCOUNTER — Other Ambulatory Visit: Payer: Self-pay | Admitting: *Deleted

## 2012-02-29 MED ORDER — FUROSEMIDE 40 MG PO TABS: 40.0000 mg | ORAL_TABLET | Freq: Every day | ORAL | Status: DC

## 2012-03-21 ENCOUNTER — Other Ambulatory Visit: Payer: Self-pay | Admitting: *Deleted

## 2012-03-21 MED ORDER — OMEPRAZOLE 20 MG PO CPDR: 20.0000 mg | DELAYED_RELEASE_CAPSULE | Freq: Every day | ORAL | Status: DC

## 2012-04-05 ENCOUNTER — Other Ambulatory Visit: Payer: Self-pay | Admitting: *Deleted

## 2012-04-05 MED ORDER — OMEPRAZOLE 20 MG PO CPDR: 20.0000 mg | DELAYED_RELEASE_CAPSULE | Freq: Every day | ORAL | Status: DC

## 2012-04-05 MED ORDER — METOPROLOL TARTRATE 25 MG PO TABS: 25.0000 mg | ORAL_TABLET | Freq: Two times a day (BID) | ORAL | Status: DC

## 2012-04-20 ENCOUNTER — Other Ambulatory Visit: Payer: Self-pay | Admitting: *Deleted

## 2012-04-20 MED ORDER — FOLIC ACID 1 MG PO TABS: 1.0000 mg | ORAL_TABLET | Freq: Every day | ORAL | Status: DC

## 2012-04-20 MED ORDER — OMEPRAZOLE 20 MG PO CPDR: 20.0000 mg | DELAYED_RELEASE_CAPSULE | Freq: Every day | ORAL | Status: DC

## 2012-04-20 MED ORDER — POTASSIUM CHLORIDE CRYS ER 20 MEQ PO TBCR: 20.0000 meq | EXTENDED_RELEASE_TABLET | Freq: Every day | ORAL | Status: DC

## 2012-04-20 MED ORDER — FUROSEMIDE 40 MG PO TABS: 40.0000 mg | ORAL_TABLET | Freq: Every day | ORAL | Status: DC

## 2012-04-20 MED ORDER — METOPROLOL TARTRATE 25 MG PO TABS: 25.0000 mg | ORAL_TABLET | Freq: Two times a day (BID) | ORAL | Status: DC

## 2012-04-26 ENCOUNTER — Observation Stay (HOSPITAL_COMMUNITY)
Admission: EM | Admit: 2012-04-26 | Discharge: 2012-04-27 | Disposition: A | Discharge status: Home / Self Care | Payer: Medicare Other | Attending: Emergency Medicine | Admitting: Emergency Medicine

## 2012-04-26 ENCOUNTER — Encounter (HOSPITAL_COMMUNITY): Payer: Self-pay

## 2012-04-26 ENCOUNTER — Emergency Department (HOSPITAL_COMMUNITY): Payer: Medicare Other

## 2012-04-26 DIAGNOSIS — I10 Essential (primary) hypertension: Secondary | ICD-10-CM | POA: Insufficient documentation

## 2012-04-26 DIAGNOSIS — R4182 Altered mental status, unspecified: Principal | ICD-10-CM | POA: Insufficient documentation

## 2012-04-26 DIAGNOSIS — I2510 Atherosclerotic heart disease of native coronary artery without angina pectoris: Secondary | ICD-10-CM | POA: Insufficient documentation

## 2012-04-26 DIAGNOSIS — E785 Hyperlipidemia, unspecified: Secondary | ICD-10-CM | POA: Insufficient documentation

## 2012-04-26 DIAGNOSIS — E119 Type 2 diabetes mellitus without complications: Secondary | ICD-10-CM | POA: Insufficient documentation

## 2012-04-26 DIAGNOSIS — R509 Fever, unspecified: Secondary | ICD-10-CM | POA: Insufficient documentation

## 2012-04-26 DIAGNOSIS — K7460 Unspecified cirrhosis of liver: Secondary | ICD-10-CM

## 2012-04-26 DIAGNOSIS — E860 Dehydration: Secondary | ICD-10-CM

## 2012-04-26 LAB — CBC
HCT: 40.2 % (ref 39.0–52.0)
MCH: 33.3 pg (ref 26.0–34.0)
MCV: 93.7 fL (ref 78.0–100.0)
Platelets: 151 10*3/uL (ref 150–400)
RDW: 13.8 % (ref 11.5–15.5)

## 2012-04-26 LAB — URINALYSIS, ROUTINE W REFLEX MICROSCOPIC
Bilirubin Urine: NEGATIVE
Glucose, UA: 500 mg/dL — AB
Hgb urine dipstick: NEGATIVE
Ketones, ur: 15 mg/dL — AB
Nitrite: NEGATIVE
Protein, ur: NEGATIVE mg/dL
Specific Gravity, Urine: 1.023 (ref 1.005–1.030)
Urobilinogen, UA: 1 mg/dL (ref 0.0–1.0)
pH: 6 (ref 5.0–8.0)

## 2012-04-26 LAB — URINE MICROSCOPIC-ADD ON

## 2012-04-26 LAB — LACTIC ACID, PLASMA
Lactic Acid, Venous: 4.1 mmol/L — ABNORMAL HIGH (ref 0.5–2.2)
Lactic Acid, Venous: 3 mmol/L — ABNORMAL HIGH (ref 0.5–2.2)

## 2012-04-26 LAB — DIFFERENTIAL
Basophils Absolute: 0 10*3/uL (ref 0.0–0.1)
Basophils Relative: 0 % (ref 0–1)
Eosinophils Absolute: 0 10*3/uL (ref 0.0–0.7)
Eosinophils Relative: 0 % (ref 0–5)
Lymphocytes Relative: 27 % (ref 12–46)
Lymphs Abs: 1.8 10*3/uL (ref 0.7–4.0)
Monocytes Absolute: 0.4 10*3/uL (ref 0.1–1.0)
Monocytes Relative: 6 % (ref 3–12)
Neutro Abs: 4.7 10*3/uL (ref 1.7–7.7)
Neutrophils Relative %: 67 % (ref 43–77)

## 2012-04-26 LAB — COMPREHENSIVE METABOLIC PANEL
ALT: 12 U/L (ref 0–53)
AST: 27 U/L (ref 0–37)
CO2: 21 mEq/L (ref 19–32)
Calcium: 10.1 mg/dL (ref 8.4–10.5)
Creatinine, Ser: 0.87 mg/dL (ref 0.50–1.35)
GFR calc Af Amer: >90 mL/min (ref >90)
GFR calc non Af Amer: 84 mL/min — ABNORMAL LOW (ref >90)
Glucose, Bld: 184 mg/dL — ABNORMAL HIGH (ref 70–99)
Sodium: 143 mEq/L (ref 135–145)
Total Protein: 6.8 g/dL (ref 6.0–8.3)

## 2012-04-26 MED ORDER — SODIUM CHLORIDE 0.9 % IV BOLUS (SEPSIS)
1000.0000 mL | Freq: Once | INTRAVENOUS | Status: AC
  Administered 2012-04-26: 1000 mL via INTRAVENOUS

## 2012-04-26 NOTE — ED Notes (Signed)
Seen and examined by Dr. Beaton. 

## 2012-04-26 NOTE — ED Notes (Signed)
Pt was brought in by EMS with altered mental status. EMS stated that he was found in his car outside of his PMD's office unresponsive. EMS claimed that he was burning up and was diaphoretic. Pt's temperature at that time was 101.1 with CBG of 202. Pt was given 200 ml of Saline and ice packs where place on is armpits, groin with wet cloth on him. Pt is now A/A/Ox4, skin is warm and dry. Pt claimed that he saw his doctor today for a check-up. Pt claimed that he's had the cough x 4days.

## 2012-04-26 NOTE — ED Provider Notes (Addendum)
History     CSN: [id number]  Arrival date & time 04/26/12  1803   First MD Initiated Contact with Patient 04/26/12 1832      Chief Complaint  Patient presents with  . Altered Mental Status     HPI Pt was brought in by EMS with altered mental status. EMS stated that he was found in his car outside of his PMD's office unresponsive. EMS claimed that he was burning up and was diaphoretic. Pt's temperature at that time was 101.1 with CBG of 202. Pt was given 200 ml of Saline and ice packs where place on is armpits, groin with wet cloth on him. Pt is now A/A/Ox4, skin is warm and dry. Pt claimed that he saw his doctor today for a check-up. Pt claimed that he's had the cough x 4days.  Past Medical History  Diagnosis Date  . CAD (coronary artery disease)     severe. s/p surgical revascularization as described.  . Thrombocytopenia     postoperative  . Anemia associated with acute blood loss     postoperative  . Cirrhosis     Hx  . Hyperbilirubinemia     postoperative  . Delirium     postoperative delirium w/elevated ammonialevels  . Hx of ventricular shunt     Hx of portacaval shunt   . Paroxysmal atrial fibrillation     Hx of postoperative paroxysmal afib and sinus tachycardia, resolved.   Marland Kitchen History of gastroesophageal reflux (GERD)   . H/O: HTN (hypertension)   . DM2 (diabetes mellitus, type 2)     Hx. non insulin dependent  . Dyslipidemia     questionable Hx  . Meniere disease     partial deafness  . Back fracture     Hx of severe fractures of the feet and back from traumatic fall w/long-term disabilites  . Dyslipidemia     Past Surgical History  Procedure Date  . Coronary artery bypass graft     x4. placed: left internal mammary artery to theleft anterior descending. saphenous vein graft to the first diagonal. saphenous vein graft to the obtuse marginal one. saphenous vein graft to the distal right coronary artery  . Cardiac catheterization   . Liver cirrhosis    with portial htn  . Portacaval shunt   . Left inguinal hernia     Family History  Problem Relation Age of Onset  . Diabetes Brother     and son  . Cancer Brother   . Cancer Sister     "in male organs"  . Heart disease Brother     History  Substance Use Topics  . Smoking status: Never Smoker   . Smokeless tobacco: Not on file   Comment: tobacco use-no  . Alcohol Use: No      Review of Systems  All other systems reviewed and are negative.    Allergies  Codeine and Penicillins  Home Medications   Current Outpatient Rx  Name Route Sig Dispense Refill  . ASPIRIN EC 325 MG PO TBEC Oral Take 325 mg by mouth daily.    Marland Kitchen HIGH POTENCY B COMPLEX/B12/C PO TABS Oral Take 1 tablet by mouth daily.    Marland Kitchen CALCIUM CARBONATE-VITAMIN D 600-400 MG-UNIT PO TABS Oral Take 1 tablet by mouth 2 (two) times daily.    Marland Kitchen VITAMIN D 1000 UNITS PO TABS Oral Take 1,000 Units by mouth daily.      Marland Kitchen FOLIC ACID 1 MG PO TABS Oral Take 1 mg by  mouth daily.    . FUROSEMIDE 40 MG PO TABS Oral Take 40 mg by mouth daily.    Marland Kitchen GLIPIZIDE ER 5 MG PO TB24 Oral Take 5 mg by mouth daily.    Marland Kitchen LISINOPRIL 20 MG PO TABS Oral Take 20 mg by mouth daily.    Marland Kitchen METFORMIN HCL ER 500 MG PO TB24 Oral Take 1,000 mg by mouth Twice daily.     Marland Kitchen METOPROLOL TARTRATE 25 MG PO TABS Oral Take 25 mg by mouth 2 (two) times daily.    Marland Kitchen NAPROXEN SODIUM 220 MG PO TABS Oral Take 220 mg by mouth daily as needed. For pain.    Marland Kitchen OMEPRAZOLE 20 MG PO CPDR Oral Take 20 mg by mouth daily.    Marland Kitchen POTASSIUM CHLORIDE CRYS ER 20 MEQ PO TBCR Oral Take 20 mEq by mouth daily.      BP 114/52  Pulse 76  Temp(Src) 97 F (36.1 C) (Oral)  Resp 17  Ht 5\' 5"  (1.651 m)  Wt 172 lb (78.019 kg)  BMI 28.62 kg/m2  SpO2 99%  Physical Exam  Nursing note and vitals reviewed. Constitutional: He is oriented to person, place, and time. He appears well-developed and well-nourished. No distress.  HENT:  Head: Normocephalic and atraumatic.  Eyes: Pupils are  equal, round, and reactive to light.  Neck: Normal range of motion.  Cardiovascular: Normal rate and intact distal pulses.   Pulmonary/Chest: No respiratory distress.  Abdominal: Normal appearance. He exhibits no distension. There is no tenderness. There is no rebound.  Musculoskeletal: Normal range of motion.  Neurological: He is alert and oriented to person, place, and time. No cranial nerve deficit or sensory deficit. GCS eye subscore is 4. GCS verbal subscore is 5. GCS motor subscore is 6.  Skin: Skin is warm and dry. No rash noted.  Psychiatric: He has a normal mood and affect. His behavior is normal.    ED Course  Procedures (including critical care time)  Labs Reviewed  URINALYSIS, ROUTINE W REFLEX MICROSCOPIC - Abnormal; Notable for the following:    Color, Urine AMBER (*) BIOCHEMICALS MAY BE AFFECTED BY COLOR   Glucose, UA 500 (*)    Ketones, ur 15 (*)    Leukocytes, UA SMALL (*)    All other components within normal limits  COMPREHENSIVE METABOLIC PANEL - Abnormal; Notable for the following:    Glucose, Bld 184 (*)    Total Bilirubin 2.1 (*)    GFR calc non Af Amer 84 (*)    All other components within normal limits  LACTIC ACID, PLASMA - Abnormal; Notable for the following:    Lactic Acid, Venous 4.1 (*)    All other components within normal limits  URINE MICROSCOPIC-ADD ON - Abnormal; Notable for the following:    Squamous Epithelial / LPF FEW (*)    All other components within normal limits  LACTIC ACID, PLASMA - Abnormal; Notable for the following:    Lactic Acid, Venous 3.0 (*)    All other components within normal limits  CBC  DIFFERENTIAL   Dg Chest 2 View  04/26/2012  *RADIOLOGY REPORT*  Clinical Data: Fever.  Generalized weakness.  Surgical history includes CABG.  CHEST - 2 VIEW  Comparison: Two-view chest x-ray 02/10/2010, 01/20/2010, 12/30/1009.  Findings: Prior sternotomy for CABG.  Cardiac silhouette normal in size for the AP technique, unchanged.   Thoracic aorta tortuous atherosclerotic, unchanged.  Hilar and mediastinal contours otherwise unremarkable.  Chronic linear scarring in the left upper  lobe and right lower lobe.  Lungs otherwise clear.  No pleural effusions.  Old compression fracture of T11 with associated kyphous deformity.  No significant interval change.  IMPRESSION: Scarring in the left upper lobe and right lower lobe.  No acute cardiopulmonary disease.  Stable examination.  Original Report Authenticated By: Arnell Sieving, M.D.     No diagnosis found.    MDM  Patient has no explanation for the fever at this time.  The elevated lactic acid is trending downward but still remains abnormal.  The plan at this time is to admit to CDU hydration overnight with repeat lactic acid and CBC in the morning.  Repeat temperature set the night.  If patient remains asymptomatic and the labs have normalized he can be discharged home in the morning.       Nelia Shi, MD 04/26/12 2204  Nelia Shi, MD 06/16/12 1134

## 2012-04-27 LAB — CBC
HCT: 34 % — ABNORMAL LOW (ref 39.0–52.0)
Hemoglobin: 12 g/dL — ABNORMAL LOW (ref 13.0–17.0)
MCH: 33.4 pg (ref 26.0–34.0)
MCHC: 35.3 g/dL (ref 30.0–36.0)
RDW: 13.8 % (ref 11.5–15.5)

## 2012-04-27 MED ORDER — LACTULOSE 10 GM/15ML PO SOLN
10.0000 g | Freq: Once | ORAL | Status: AC
  Administered 2012-04-27: 10 g via ORAL
  Filled 2012-04-27: qty 15

## 2012-04-27 MED ORDER — SODIUM CHLORIDE 0.9 % IV SOLN
Freq: Once | INTRAVENOUS | Status: AC
  Administered 2012-04-27: 01:00:00 via INTRAVENOUS

## 2012-04-27 MED ORDER — LACTULOSE 10 GM/15ML PO SOLN: 10.0000 g | Freq: Every day | ORAL | Status: AC

## 2012-04-27 NOTE — ED Notes (Signed)
MD at bedside. 

## 2012-04-27 NOTE — ED Provider Notes (Addendum)
Pt at baseline, did well overnight, no distress Son at bedside who confirms he is at baseline He reports that only thing new is that he "falls asleep" more frequently, and apparently has seen his PCP for this He reports he likely fell asleep in car Will check ammonia as has h/o cirrhosis, though mentating appropriately now, maex4, no distress D/w his PCP dr nyland.  He is aware of patient, just saw him yesterday and was unaware of what happened to him He thought he might have some mild depression and considered starting lexapro He will f/u with him next week BP 108/47  Pulse 76  Temp(Src) 97.9 F (36.6 C) (Oral)  Resp 15  Ht 5\' 5"  (1.651 m)  Wt 172 lb (78.019 kg)  BMI 28.62 kg/m2  SpO2 99%   Joya Gaskins, MD 04/27/12 0912  9:39 AM Pt did have mildly elevated ammonia, similar to prior, would be reasonable to start low dose of lactulose until seen by PCP as this could be cause of drowsiness at times.  He is awake/alert, at this time  Joya Gaskins, MD 04/27/12 [phone]

## 2012-04-27 NOTE — ED Notes (Signed)
Requested lactulose be sent from main pharmacy at this time

## 2012-04-27 NOTE — ED Notes (Signed)
Son at bedside reports patient has been "excessively sleeping" lately, but states that it has been around the house. MD made aware

## 2012-04-27 NOTE — ED Notes (Signed)
Case manager at bedside 

## 2012-04-27 NOTE — ED Notes (Signed)
Meal tray requested at this time

## 2012-04-27 NOTE — Discharge Instructions (Signed)
Dehydration, Adult Dehydration means your body does not have as much fluid as it needs. Your kidneys, brain, and heart will not work properly without the right amount of fluids and salt.  HOME CARE  Ask your doctor how to replace body fluid losses (rehydrate).   Drink enough fluids to keep your pee (urine) clear or pale yellow.   Drink small amounts of fluids often if you feel sick to your stomach (nauseous) or throw up (vomit).   Eat like you normally do.   Avoid:   Foods or drinks high in sugar.   Bubbly (carbonated) drinks.   Juice.   Very hot or cold fluids.   Drinks with caffeine.   Fatty, greasy foods.   Alcohol.   Tobacco.   Eating too much.   Gelatin desserts.   Wash your hands to avoid spreading germs (bacteria, viruses).   Only take medicine as told by your doctor.   Keep all doctor visits as told.  GET HELP RIGHT AWAY IF:   You cannot drink something without throwing up.   You get worse even with treatment.   Your vomit has blood in it or looks greenish.   Your poop (stool) has blood in it or looks black and tarry.   You have not peed in 6 to 8 hours.   You pee a small amount of very dark pee.   You have a fever.   You pass out (faint).   You have belly (abdominal) pain that gets worse or stays in one spot (localizes).   You have a rash, stiff neck, or bad headache.   You get easily annoyed, sleepy, or are hard to wake up.   You feel weak, dizzy, or very thirsty.  MAKE SURE YOU:   Understand these instructions.   Will watch your condition.   Will get help right away if you are not doing well or get worse.  Document Released: 09/19/2009 Document Revised: 11/12/2011 Document Reviewed: 07/13/2011 [employer] Patient Information 2012 [employer]. 

## 2012-04-27 NOTE — ED Notes (Signed)
Attempt to contact son Iran Rowe at [phone] unsuccessful. Message left.

## 2012-04-27 NOTE — ED Notes (Signed)
[facility] Nurse at bedside

## 2012-04-27 NOTE — Progress Notes (Signed)
Observation review is complete. 

## 2012-06-16 ENCOUNTER — Ambulatory Visit (INDEPENDENT_AMBULATORY_CARE_PROVIDER_SITE_OTHER): Payer: Medicare Other | Admitting: Cardiovascular Disease

## 2012-06-16 ENCOUNTER — Encounter: Payer: Self-pay | Admitting: Cardiovascular Disease

## 2012-06-16 VITALS — BP 113/65 | HR 62 | Ht 64.0 in | Wt 176.0 lb

## 2012-06-16 DIAGNOSIS — I10 Essential (primary) hypertension: Secondary | ICD-10-CM

## 2012-06-16 DIAGNOSIS — I2510 Atherosclerotic heart disease of native coronary artery without angina pectoris: Secondary | ICD-10-CM

## 2012-06-16 DIAGNOSIS — I4891 Unspecified atrial fibrillation: Secondary | ICD-10-CM

## 2012-06-16 MED ORDER — FUROSEMIDE 40 MG PO TABS: 40.0000 mg | ORAL_TABLET | Freq: Every day | ORAL | Status: DC

## 2012-06-16 MED ORDER — ASPIRIN EC 81 MG PO TBEC: 81.0000 mg | DELAYED_RELEASE_TABLET | Freq: Every day | ORAL | Status: DC

## 2012-06-16 MED ORDER — FOLIC ACID 1 MG PO TABS: 1.0000 mg | ORAL_TABLET | Freq: Every day | ORAL | Status: DC

## 2012-06-16 MED ORDER — OMEPRAZOLE 20 MG PO CPDR: 20.0000 mg | DELAYED_RELEASE_CAPSULE | Freq: Every day | ORAL | Status: DC

## 2012-06-16 MED ORDER — POTASSIUM CHLORIDE CRYS ER 20 MEQ PO TBCR: 20.0000 meq | EXTENDED_RELEASE_TABLET | Freq: Every day | ORAL | Status: DC

## 2012-06-16 MED ORDER — METOPROLOL TARTRATE 25 MG PO TABS: 25.0000 mg | ORAL_TABLET | Freq: Two times a day (BID) | ORAL | Status: DC

## 2012-06-16 NOTE — Assessment & Plan Note (Signed)
He seems to be stable with no symptoms suggestive of angina. Continue medical therapy.

## 2012-06-16 NOTE — Assessment & Plan Note (Signed)
This was post operative only. He had no recent events of this. No indication for anticoagulation at this time especially with his known history of cirrhosis.

## 2012-06-16 NOTE — Progress Notes (Signed)
HPI  The patient is a 71 year-old male with a history of cirrhosis and portacaval shunt.  He has no history of coronary artery disease status post CABG in January of 2011. He had postoperative atrial fibrillation but none recently. The patient lives by himself as he is widowed. He takes care of his 1 year old dog who is at end-of-life. The patient was informed about sleep disordered breathing recently and was asked not to drive anymore. He has not driven in the last 3 months.  From a cardiac standpoint, he seems to be stable with no chest pain, dyspnea, orthopnea, PND or palpitations.    Allergies  Allergen Reactions  . Codeine   . Penicillins      Current Outpatient Prescriptions on File Prior to Visit  Medication Sig Dispense Refill  . atorvastatin (LIPITOR) 10 MG tablet Take 1 tablet by mouth Daily.      . B Complex-C (HIGH POTENCY B COMPLEX/B12/C) TABS Take 1 tablet by mouth daily.      . Calcium Carbonate-Vitamin D (CALCIUM 600+D) 600-400 MG-UNIT per tablet Take 1 tablet by mouth 2 (two) times daily.      Marland Kitchen escitalopram (LEXAPRO) 10 MG tablet Take 5 mg by mouth Daily.       Marland Kitchen glipiZIDE (GLUCOTROL XL) 5 MG 24 hr tablet Take 5 mg by mouth daily.      . metFORMIN (GLUCOPHAGE-XR) 500 MG 24 hr tablet Take 1,000 mg by mouth Twice daily.       Marland Kitchen DISCONTD: furosemide (LASIX) 40 MG tablet Take 40 mg by mouth daily.      Marland Kitchen DISCONTD: metoprolol tartrate (LOPRESSOR) 25 MG tablet Take 25 mg by mouth 2 (two) times daily.      Marland Kitchen DISCONTD: omeprazole (PRILOSEC) 20 MG capsule Take 20 mg by mouth daily.      Marland Kitchen DISCONTD: potassium chloride SA (K-DUR,KLOR-CON) 20 MEQ tablet Take 20 mEq by mouth daily.         Past Medical History  Diagnosis Date  . CAD (coronary artery disease)     severe. s/p surgical revascularization as described.  . Thrombocytopenia     postoperative  . Anemia associated with acute blood loss     postoperative  . Cirrhosis     Hx  . Hyperbilirubinemia    postoperative  . Delirium     postoperative delirium w/elevated ammonialevels  . Hx of ventricular shunt     Hx of portacaval shunt   . Paroxysmal atrial fibrillation     Hx of postoperative paroxysmal afib and sinus tachycardia, resolved.   Marland Kitchen History of gastroesophageal reflux (GERD)   . H/O: HTN (hypertension)   . DM2 (diabetes mellitus, type 2)     Hx. non insulin dependent  . Dyslipidemia     questionable Hx  . Meniere disease     partial deafness  . Back fracture     Hx of severe fractures of the feet and back from traumatic fall w/long-term disabilites  . Dyslipidemia      Past Surgical History  Procedure Date  . Coronary artery bypass graft     x4. placed: left internal mammary artery to theleft anterior descending. saphenous vein graft to the first diagonal. saphenous vein graft to the obtuse marginal one. saphenous vein graft to the distal right coronary artery  . Cardiac catheterization   . Liver cirrhosis     with portial htn  . Portacaval shunt   . Left inguinal hernia  Family History  Problem Relation Age of Onset  . Diabetes Brother     and son  . Cancer Brother   . Cancer Sister     "in male organs"  . Heart disease Brother      History   Social History  . Marital Status: Widowed    Spouse Name: N/A    Number of Children: N/A  . Years of Education: N/A   Occupational History  . Not on file.   Social History Main Topics  . Smoking status: Former Smoker -- 2.0 packs/day for 20 years    Types: Cigars    Quit date: 06/16/1989  . Smokeless tobacco: Never Used   Comment: tobacco use-no  . Alcohol Use: No  . Drug Use: No  . Sexually Active: Not on file   Other Topics Concern  . Not on file   Social History Narrative   Disabled. Widowed, wife had Alzheimer's. Daily caffeine use- 3 cups of coffee/day     PHYSICAL EXAM   BP 113/65  Pulse 62  Ht 5\' 4"  (1.626 m)  Wt 176 lb (79.833 kg)  BMI 30.21 kg/m2  SpO2 97% Constitutional:  He is oriented to person, place, and time. He appears well-developed and well-nourished. No distress.  HENT: No nasal discharge.  Head: Normocephalic and atraumatic.  Eyes: Pupils are equal and round. Right eye exhibits no discharge. Left eye exhibits no discharge.  Neck: Normal range of motion. Neck supple. No JVD present. No thyromegaly present.  Cardiovascular: Normal rate, regular rhythm, normal heart sounds and. Exam reveals no gallop and no friction rub. No murmur heard.  Pulmonary/Chest: Effort normal and breath sounds normal. No stridor. No respiratory distress. He has no wheezes. He has no rales. He exhibits no tenderness.  Abdominal: Soft. Bowel sounds are normal. He exhibits no distension. There is no tenderness. There is no rebound and no guarding.  Musculoskeletal: Normal range of motion. He exhibits no edema and no tenderness.  Neurological: He is alert and oriented to person, place, and time. Coordination normal.  Skin: Skin is warm and dry. No rash noted. He is not diaphoretic. No erythema. No pallor.  Psychiatric: He has a normal mood and affect. His behavior is normal. Judgment and thought content normal.       EKG: Normal sinus rhythm with low voltage. No significant ST or T wave changes.   ASSESSMENT AND PLAN

## 2012-06-16 NOTE — Assessment & Plan Note (Signed)
His blood pressure is well-controlled 

## 2012-07-19 ENCOUNTER — Other Ambulatory Visit: Payer: Self-pay | Admitting: *Deleted

## 2012-07-19 MED ORDER — FUROSEMIDE 40 MG PO TABS: 40.0000 mg | ORAL_TABLET | Freq: Every day | ORAL | Status: DC

## 2012-10-05 ENCOUNTER — Other Ambulatory Visit: Payer: Self-pay | Admitting: Cardiology

## 2012-10-05 MED ORDER — FOLIC ACID 1 MG PO TABS: 1.0000 mg | ORAL_TABLET | Freq: Every day | ORAL | Status: DC

## 2012-12-19 ENCOUNTER — Encounter: Payer: Self-pay | Admitting: Cardiology

## 2012-12-19 ENCOUNTER — Ambulatory Visit (INDEPENDENT_AMBULATORY_CARE_PROVIDER_SITE_OTHER): Payer: Medicare Other | Admitting: Cardiology

## 2012-12-19 VITALS — BP 144/78 | HR 70 | Ht 67.0 in | Wt 170.0 lb

## 2012-12-19 DIAGNOSIS — I2510 Atherosclerotic heart disease of native coronary artery without angina pectoris: Secondary | ICD-10-CM

## 2012-12-19 DIAGNOSIS — I10 Essential (primary) hypertension: Secondary | ICD-10-CM

## 2012-12-19 DIAGNOSIS — E119 Type 2 diabetes mellitus without complications: Secondary | ICD-10-CM

## 2012-12-19 DIAGNOSIS — I4891 Unspecified atrial fibrillation: Secondary | ICD-10-CM

## 2012-12-19 NOTE — Assessment & Plan Note (Signed)
No change to current regimen. Keep follow up with Dr. Lysbeth Galas.

## 2012-12-19 NOTE — Patient Instructions (Addendum)

## 2012-12-19 NOTE — Assessment & Plan Note (Addendum)
Symptomatically stable medical therapy status post CABG in 2011. ECG reviewed also stable. Continue medical therapy and observation. No regular exercise noted with functional limitations at baseline.

## 2012-12-19 NOTE — Assessment & Plan Note (Signed)
None noted since postoperative setting.

## 2012-12-19 NOTE — Progress Notes (Signed)
Clinical Summary [name] is a 72 y.o.male presenting for followup. He is a former patient of Dr. Andee Page, last seen in July 2012. He presents today without specific complaints of angina, states he has used nitroglycerin only one-time since surgery. He reports no palpitations, has had no obvious breakthrough atrial fibrillation.  ECG today shows sinus rhythm with left axis deviation and poor anterior R-wave progression. Last echocardiogram in 2011 revealed LVEF 55-60%, grade 1 diastolic dysfunction.  He is functionally limited, has been disabled, uses a cane to ambulate and can walk essentially 100-200 feet but has to stop. He reports NYHA class 2-3 dyspnea which has been chronic.  States he had recent lab report Dr. Lysbeth Page. He has not been on statin therapy.   Allergies  Allergen Reactions  . Codeine   . Penicillins     Current Outpatient Prescriptions  Medication Sig Dispense Refill  . aspirin EC 81 MG tablet Take 1 tablet (81 mg total) by mouth daily.      . B Complex-C (HIGH POTENCY B COMPLEX/B12/C) TABS Take 1 tablet by mouth daily.      . Calcium Carbonate-Vitamin D (CALCIUM 600+D) 600-400 MG-UNIT per tablet Take 1 tablet by mouth 2 (two) times daily.      Marland Kitchen escitalopram (LEXAPRO) 10 MG tablet Take 5 mg by mouth Daily.       . folic acid (FOLVITE) 1 MG tablet Take 1 tablet (1 mg total) by mouth daily.  15 tablet  0  . furosemide (LASIX) 40 MG tablet Take 1 tablet (40 mg total) by mouth daily.  30 tablet  6  . glipiZIDE (GLUCOTROL XL) 5 MG 24 hr tablet Take 5 mg by mouth daily.      Marland Kitchen lactulose (CHRONULAC) 10 GM/15ML solution Take 15 mLs by mouth Three times a day.       . lisinopril (PRINIVIL,ZESTRIL) 5 MG tablet Take 5 mg by mouth daily.      . metFORMIN (GLUCOPHAGE-XR) 500 MG 24 hr tablet Take 1,000 mg by mouth Twice daily.       . metoprolol tartrate (LOPRESSOR) 25 MG tablet Take 1 tablet (25 mg total) by mouth 2 (two) times daily.  60 tablet  6  . omeprazole (PRILOSEC) 20  MG capsule Take 1 capsule (20 mg total) by mouth daily.  30 capsule  0  . potassium chloride SA (K-DUR,KLOR-CON) 20 MEQ tablet Take 1 tablet (20 mEq total) by mouth daily.  30 tablet  6    Past Medical History  Diagnosis Date  . Coronary atherosclerosis of native coronary artery     Multivessel status post CABG  . Cirrhosis   . Paroxysmal atrial fibrillation     Postoperative  . GERD (gastroesophageal reflux disease)   . Essential hypertension, benign   . DM2 (diabetes mellitus, type 2)   . Dyslipidemia   . Meniere disease     Partial deafness  . Back fracture     Severe fractures of the feet and back from traumatic fall w/long-term disabilites    Past Surgical History  Procedure Date  . Coronary artery bypass graft 2011    LIMA to LAD, SVG to diagonal, SVG to OM, SVG to RCA  . Portacaval shunt   . Left inguinal hernia     Social History [name] reports that he quit smoking about 23 years ago. His smoking use included Cigarettes and Cigars. He has a 40 pack-year smoking history. He has never used smokeless tobacco. [name]  reports that he does not drink alcohol.  Review of Systems Reports recent spider bite on right foot. No falls. Has tremor mainly involving the left side. No reported bleeding episodes. Otherwise negative.  Physical Examination Filed Vitals:   12/19/12 1437  BP: 144/78  Pulse: 70   Filed Weights   12/19/12 1437  Weight: 170 lb (77.111 kg)   No acute distress. HEENT: Conjunctiva and lids normal, oropharynx clear with poor dentition. Neck: Supple, no elevated JVP or carotid bruits, no thyromegaly. Lungs: Diminished but clear to auscultation, nonlabored breathing at rest. Cardiac: Regular rate and rhythm, no S3, soft systolic murmur, no pericardial rub. Abdomen: Soft, nontender, protuberant, bowel sounds present. Extremities: No pitting edema, distal pulses 1+. Venous stasis. Skin: Warm and dry. Musculoskeletal: No kyphosis. Neuropsychiatric:  Alert and oriented x3, affect grossly appropriate.   Problem List and Plan   CORONARY ATHEROSCLEROSIS NATIVE CORONARY ARTERY Symptomatically stable medical therapy status post CABG in 2011. ECG reviewed also stable. Continue medical therapy and observation. No regular exercise noted with functional limitations at baseline.  Essential hypertension, benign No change to current regimen. Keep follow up with Dr. Lysbeth Page.  ATRIAL FIBRILLATION None noted since postoperative setting.  Type 2 diabetes mellitus Followed by Dr. Lysbeth Page.    Anthony Page, M.D., F.A.C.C.

## 2012-12-19 NOTE — Assessment & Plan Note (Signed)
Followed by Dr. Lysbeth Galas.

## 2013-02-02 ENCOUNTER — Inpatient Hospital Stay (HOSPITAL_COMMUNITY)
Admission: EM | Admit: 2013-02-02 | Discharge: 2013-02-07 | DRG: 480 | Disposition: A | Discharge status: Skilled Nursing Facility | Payer: Medicare Other | Attending: Internal Medicine | Admitting: Internal Medicine

## 2013-02-02 ENCOUNTER — Emergency Department (HOSPITAL_COMMUNITY): Payer: Medicare Other

## 2013-02-02 ENCOUNTER — Encounter (HOSPITAL_COMMUNITY): Payer: Self-pay | Admitting: Family Medicine

## 2013-02-02 DIAGNOSIS — S2231XA Fracture of one rib, right side, initial encounter for closed fracture: Secondary | ICD-10-CM

## 2013-02-02 DIAGNOSIS — K219 Gastro-esophageal reflux disease without esophagitis: Secondary | ICD-10-CM | POA: Diagnosis present

## 2013-02-02 DIAGNOSIS — S72141A Displaced intertrochanteric fracture of right femur, initial encounter for closed fracture: Secondary | ICD-10-CM

## 2013-02-02 DIAGNOSIS — E119 Type 2 diabetes mellitus without complications: Secondary | ICD-10-CM

## 2013-02-02 DIAGNOSIS — E785 Hyperlipidemia, unspecified: Secondary | ICD-10-CM | POA: Diagnosis present

## 2013-02-02 DIAGNOSIS — Z4789 Encounter for other orthopedic aftercare: Secondary | ICD-10-CM

## 2013-02-02 DIAGNOSIS — I519 Heart disease, unspecified: Secondary | ICD-10-CM | POA: Diagnosis present

## 2013-02-02 DIAGNOSIS — I4891 Unspecified atrial fibrillation: Secondary | ICD-10-CM

## 2013-02-02 DIAGNOSIS — Z01810 Encounter for preprocedural cardiovascular examination: Secondary | ICD-10-CM

## 2013-02-02 DIAGNOSIS — S72001A Fracture of unspecified part of neck of right femur, initial encounter for closed fracture: Secondary | ICD-10-CM

## 2013-02-02 DIAGNOSIS — D509 Iron deficiency anemia, unspecified: Secondary | ICD-10-CM

## 2013-02-02 DIAGNOSIS — Z01818 Encounter for other preprocedural examination: Secondary | ICD-10-CM

## 2013-02-02 DIAGNOSIS — I10 Essential (primary) hypertension: Secondary | ICD-10-CM

## 2013-02-02 DIAGNOSIS — I2510 Atherosclerotic heart disease of native coronary artery without angina pectoris: Secondary | ICD-10-CM

## 2013-02-02 DIAGNOSIS — I480 Paroxysmal atrial fibrillation: Secondary | ICD-10-CM

## 2013-02-02 DIAGNOSIS — W19XXXA Unspecified fall, initial encounter: Secondary | ICD-10-CM | POA: Diagnosis present

## 2013-02-02 DIAGNOSIS — S72009A Fracture of unspecified part of neck of unspecified femur, initial encounter for closed fracture: Secondary | ICD-10-CM

## 2013-02-02 DIAGNOSIS — D696 Thrombocytopenia, unspecified: Secondary | ICD-10-CM | POA: Diagnosis present

## 2013-02-02 DIAGNOSIS — Z79899 Other long term (current) drug therapy: Secondary | ICD-10-CM

## 2013-02-02 DIAGNOSIS — H8109 Meniere's disease, unspecified ear: Secondary | ICD-10-CM | POA: Diagnosis present

## 2013-02-02 DIAGNOSIS — K7682 Hepatic encephalopathy: Secondary | ICD-10-CM

## 2013-02-02 DIAGNOSIS — Z951 Presence of aortocoronary bypass graft: Secondary | ICD-10-CM

## 2013-02-02 DIAGNOSIS — Z880 Allergy status to penicillin: Secondary | ICD-10-CM

## 2013-02-02 DIAGNOSIS — K7460 Unspecified cirrhosis of liver: Secondary | ICD-10-CM

## 2013-02-02 DIAGNOSIS — S72143A Displaced intertrochanteric fracture of unspecified femur, initial encounter for closed fracture: Principal | ICD-10-CM | POA: Diagnosis present

## 2013-02-02 LAB — CBC
HCT: 34.8 % — ABNORMAL LOW (ref 39.0–52.0)
Hemoglobin: 12.3 g/dL — ABNORMAL LOW (ref 13.0–17.0)
MCV: 94.6 fL (ref 78.0–100.0)
RBC: 3.68 MIL/uL — ABNORMAL LOW (ref 4.22–5.81)
WBC: 7.4 10*3/uL (ref 4.0–10.5)

## 2013-02-02 LAB — PROTIME-INR
INR: 1.31 (ref 0.00–1.49)
Prothrombin Time: 16 seconds — ABNORMAL HIGH (ref 11.6–15.2)

## 2013-02-02 LAB — CBC WITH DIFFERENTIAL/PLATELET
Eosinophils Absolute: 0 10*3/uL (ref 0.0–0.7)
Hemoglobin: 13.1 g/dL (ref 13.0–17.0)
Lymphs Abs: 1.1 10*3/uL (ref 0.7–4.0)
MCH: 32.9 pg (ref 26.0–34.0)
Monocytes Relative: 10 % (ref 3–12)
Neutro Abs: 6.6 10*3/uL (ref 1.7–7.7)
Neutrophils Relative %: 77 % (ref 43–77)
Platelets: 134 10*3/uL — ABNORMAL LOW (ref 150–400)
RBC: 3.98 MIL/uL — ABNORMAL LOW (ref 4.22–5.81)
WBC: 8.6 10*3/uL (ref 4.0–10.5)

## 2013-02-02 LAB — HEPATIC FUNCTION PANEL
Albumin: 2.7 g/dL — ABNORMAL LOW (ref 3.5–5.2)
Total Protein: 6.2 g/dL (ref 6.0–8.3)

## 2013-02-02 LAB — PHOSPHORUS: Phosphorus: 1.6 mg/dL — ABNORMAL LOW (ref 2.3–4.6)

## 2013-02-02 LAB — RAPID URINE DRUG SCREEN, HOSP PERFORMED
Barbiturates: NOT DETECTED
Cocaine: NOT DETECTED

## 2013-02-02 LAB — TYPE AND SCREEN: ABO/RH(D): O NEG

## 2013-02-02 LAB — MAGNESIUM: Magnesium: 1.9 mg/dL (ref 1.5–2.5)

## 2013-02-02 LAB — GLUCOSE, CAPILLARY: Glucose-Capillary: 285 mg/dL — ABNORMAL HIGH (ref 70–99)

## 2013-02-02 LAB — BASIC METABOLIC PANEL
Calcium: 9.5 mg/dL (ref 8.4–10.5)
GFR calc Af Amer: >90 mL/min (ref >90)
GFR calc non Af Amer: >90 mL/min (ref >90)
Potassium: 3.7 mEq/L (ref 3.5–5.1)
Sodium: 143 mEq/L (ref 135–145)

## 2013-02-02 LAB — CREATININE, SERUM: GFR calc Af Amer: >90 mL/min (ref >90)

## 2013-02-02 MED ORDER — INSULIN ASPART 100 UNIT/ML ~~LOC~~ SOLN
0.0000 [IU] | Freq: Three times a day (TID) | SUBCUTANEOUS | Status: DC
  Administered 2013-02-03 – 2013-02-04 (×2): 2 [IU] via SUBCUTANEOUS
  Administered 2013-02-04 (×2): 3 [IU] via SUBCUTANEOUS
  Administered 2013-02-05: 5 [IU] via SUBCUTANEOUS
  Administered 2013-02-05 (×2): 3 [IU] via SUBCUTANEOUS
  Administered 2013-02-06: 8 [IU] via SUBCUTANEOUS
  Administered 2013-02-06: 5 [IU] via SUBCUTANEOUS
  Administered 2013-02-06: 3 [IU] via SUBCUTANEOUS
  Administered 2013-02-07: 8 [IU] via SUBCUTANEOUS
  Administered 2013-02-07: 3 [IU] via SUBCUTANEOUS

## 2013-02-02 MED ORDER — ONDANSETRON HCL 4 MG/2ML IJ SOLN: 4.0000 mg | Freq: Four times a day (QID) | INTRAMUSCULAR | Status: DC | PRN

## 2013-02-02 MED ORDER — SODIUM CHLORIDE 0.9 % IJ SOLN: 3.0000 mL | Freq: Two times a day (BID) | INTRAMUSCULAR | Status: DC

## 2013-02-02 MED ORDER — HYDROCODONE-ACETAMINOPHEN 5-325 MG PO TABS
1.0000 | ORAL_TABLET | ORAL | Status: DC | PRN
  Administered 2013-02-02: 2 via ORAL
  Filled 2013-02-02: qty 2

## 2013-02-02 MED ORDER — ONDANSETRON HCL 4 MG PO TABS: 4.0000 mg | ORAL_TABLET | Freq: Four times a day (QID) | ORAL | Status: DC | PRN

## 2013-02-02 MED ORDER — SODIUM CHLORIDE 0.9 % IV SOLN
INTRAVENOUS | Status: AC
  Administered 2013-02-02: 23:00:00 via INTRAVENOUS

## 2013-02-02 MED ORDER — ASPIRIN EC 81 MG PO TBEC
81.0000 mg | DELAYED_RELEASE_TABLET | Freq: Every day | ORAL | Status: DC
  Filled 2013-02-02: qty 1

## 2013-02-02 MED ORDER — INSULIN ASPART 100 UNIT/ML ~~LOC~~ SOLN
0.0000 [IU] | Freq: Every day | SUBCUTANEOUS | Status: DC
  Administered 2013-02-02 – 2013-02-06 (×2): 3 [IU] via SUBCUTANEOUS

## 2013-02-02 MED ORDER — FENTANYL CITRATE 0.05 MG/ML IJ SOLN
50.0000 ug | INTRAMUSCULAR | Status: DC | PRN
  Administered 2013-02-02: 50 ug via INTRAVENOUS
  Filled 2013-02-02: qty 2

## 2013-02-02 MED ORDER — FOLIC ACID 1 MG PO TABS: 1.0000 mg | ORAL_TABLET | Freq: Every day | ORAL | Status: DC

## 2013-02-02 MED ORDER — FUROSEMIDE 40 MG PO TABS
40.0000 mg | ORAL_TABLET | Freq: Every day | ORAL | Status: DC
  Administered 2013-02-04 – 2013-02-07 (×4): 40 mg via ORAL
  Filled 2013-02-02 (×5): qty 1

## 2013-02-02 MED ORDER — POLYETHYLENE GLYCOL 3350 17 G PO PACK
17.0000 g | PACK | Freq: Every day | ORAL | Status: DC | PRN
  Filled 2013-02-02: qty 1

## 2013-02-02 MED ORDER — INSULIN ASPART 100 UNIT/ML ~~LOC~~ SOLN
3.0000 [IU] | Freq: Three times a day (TID) | SUBCUTANEOUS | Status: DC
  Administered 2013-02-04 – 2013-02-06 (×9): 3 [IU] via SUBCUTANEOUS

## 2013-02-02 MED ORDER — METOPROLOL TARTRATE 25 MG PO TABS
25.0000 mg | ORAL_TABLET | Freq: Two times a day (BID) | ORAL | Status: DC
  Administered 2013-02-02 – 2013-02-07 (×10): 25 mg via ORAL
  Filled 2013-02-02 (×12): qty 1

## 2013-02-02 MED ORDER — HEPARIN SODIUM (PORCINE) 5000 UNIT/ML IJ SOLN
5000.0000 [IU] | Freq: Three times a day (TID) | INTRAMUSCULAR | Status: DC
  Administered 2013-02-02: 5000 [IU] via SUBCUTANEOUS
  Filled 2013-02-02 (×5): qty 1

## 2013-02-02 MED ORDER — CLINDAMYCIN PHOSPHATE 900 MG/50ML IV SOLN
900.0000 mg | Freq: Three times a day (TID) | INTRAVENOUS | Status: AC
  Administered 2013-02-03: 900 mg via INTRAVENOUS
  Filled 2013-02-02: qty 50

## 2013-02-02 MED ORDER — SODIUM CHLORIDE 0.9 % IV SOLN: 250.0000 mL | INTRAVENOUS | Status: DC | PRN

## 2013-02-02 MED ORDER — POTASSIUM CHLORIDE CRYS ER 20 MEQ PO TBCR
20.0000 meq | EXTENDED_RELEASE_TABLET | Freq: Every day | ORAL | Status: DC
  Administered 2013-02-04 – 2013-02-06 (×3): 20 meq via ORAL
  Filled 2013-02-02 (×5): qty 1

## 2013-02-02 MED ORDER — SODIUM CHLORIDE 0.9 % IJ SOLN
3.0000 mL | Freq: Two times a day (BID) | INTRAMUSCULAR | Status: DC
  Administered 2013-02-02: 3 mL via INTRAVENOUS

## 2013-02-02 MED ORDER — ESCITALOPRAM OXALATE 10 MG PO TABS
10.0000 mg | ORAL_TABLET | Freq: Every day | ORAL | Status: DC
  Administered 2013-02-04 – 2013-02-07 (×4): 10 mg via ORAL
  Filled 2013-02-02 (×5): qty 1

## 2013-02-02 MED ORDER — SODIUM CHLORIDE 0.9 % IV SOLN
1000.0000 mL | INTRAVENOUS | Status: DC
  Administered 2013-02-02: 1000 mL via INTRAVENOUS

## 2013-02-02 MED ORDER — FOLIC ACID 1 MG PO TABS
1.0000 mg | ORAL_TABLET | Freq: Every day | ORAL | Status: DC
  Administered 2013-02-04 – 2013-02-07 (×4): 1 mg via ORAL
  Filled 2013-02-02 (×5): qty 1

## 2013-02-02 MED ORDER — LACTULOSE 10 GM/15ML PO SOLN
10.0000 g | Freq: Two times a day (BID) | ORAL | Status: DC
  Administered 2013-02-02 – 2013-02-04 (×3): 10 g via ORAL
  Filled 2013-02-02 (×6): qty 15

## 2013-02-02 MED ORDER — SODIUM CHLORIDE 0.9 % IJ SOLN: 3.0000 mL | INTRAMUSCULAR | Status: DC | PRN

## 2013-02-02 MED ORDER — PANTOPRAZOLE SODIUM 40 MG PO TBEC
40.0000 mg | DELAYED_RELEASE_TABLET | Freq: Every day | ORAL | Status: DC
  Administered 2013-02-04 – 2013-02-07 (×4): 40 mg via ORAL
  Filled 2013-02-02 (×4): qty 1

## 2013-02-02 MED ORDER — SODIUM CHLORIDE 0.9 % IV SOLN
1000.0000 mL | INTRAVENOUS | Status: DC
  Administered 2013-02-03: 1000 mL via INTRAVENOUS

## 2013-02-02 MED ORDER — VITAMIN B-1 100 MG PO TABS
100.0000 mg | ORAL_TABLET | Freq: Every day | ORAL | Status: DC
  Filled 2013-02-02: qty 1

## 2013-02-02 NOTE — ED Notes (Signed)
Pt had a fall today, complaining of right knee, thigh and hip. Also into right abdomen. Tenderness to right quadrant. No deformity and pulses present. Pt has tremors due to Parkinson and walks with cane. Pt non compliant with meds.

## 2013-02-02 NOTE — H&P (Signed)
[facility] History and Physical  KHADAR MONGER JYN:[id number] DOB: 06/12/1941 DOA: 02/02/2013  Referring physician: Dr. Bernette Mayers PCP: Josue Hector, MD  Specialists: Dr. Shelle Iron  Chief Complaint: hip fracture  HPI: Anthony Page is a 72 y.o. male  With past medical history of cirrhosis and status post portal couple shunt, CAD status post CABG, thrombocytopenia, delirium post reparative, paroxysmal A. fib currently not on Coumadin secondary to his elevated PT and INR and liver disease, diastolic heart failure with an echo in 2011 revealed LVEF 55-60%, grade 1 diastolic dysfunction. Comes in for a followup earlier this morning landing on his right side. From cleaning of right knee and hip pain. Is also complaining of rib pain. In the emergency room x-ray was done that showedAcute intertrochanteric fracture of the right hip. The x-ray does not show any evidence of fracture and rib x-rayNondisplaced right fourth - seventh rib fractures, age  indeterminate.   Review of Systems: The patient denies anorexia, fever, weight loss,, vision loss, decreased hearing, hoarseness, chest pain, syncope, dyspnea on exertion, peripheral edema, balance deficits, hemoptysis, abdominal pain, melena, hematochezia, severe indigestion/heartburn, hematuria, incontinence, genital sores, muscle weakness, suspicious skin lesions, transient blindness, difficulty walking, depression, unusual weight change, abnormal bleeding, enlarged lymph nodes, angioedema, and breast masses.    Past Medical History  Diagnosis Date  . Coronary atherosclerosis of native coronary artery     Multivessel status post CABG  . Cirrhosis   . Paroxysmal atrial fibrillation     Postoperative  . GERD (gastroesophageal reflux disease)   . Essential hypertension, benign   . DM2 (diabetes mellitus, type 2)   . Dyslipidemia   . Meniere disease     Partial deafness  . Back fracture     Severe fractures of the feet and back from  traumatic fall w/long-term disabilites   Past Surgical History  Procedure Laterality Date  . Coronary artery bypass graft  2011    LIMA to LAD, SVG to diagonal, SVG to OM, SVG to RCA  . Portacaval shunt    . Left inguinal hernia     Social History:  reports that he quit smoking about 23 years ago. His smoking use included Cigarettes and Cigars. He has a 40 pack-year smoking history. He has never used smokeless tobacco. He reports that he does not drink alcohol or use illicit drugs. Is at home her daughter can perform most of his ADLs  Allergies  Allergen Reactions  . Codeine   . Penicillins     Family History  Problem Relation Age of Onset  . Diabetes Brother   . Cancer Brother   . Cancer Sister   . Heart disease Brother   . Heart disease Mother     Prior to Admission medications   Medication Sig Start Date End Date Taking? Authorizing Provider  aspirin EC 81 MG tablet Take 81 mg by mouth daily. 06/16/12 06/16/13 Yes Iran Ouch, MD  escitalopram (LEXAPRO) 10 MG tablet Take 10 mg by mouth Daily.  06/08/12  Yes Historical Provider, MD  folic acid (FOLVITE) 1 MG tablet Take 1 mg by mouth daily. 10/05/12 10/05/13 Yes Prescott Parma, PA  furosemide (LASIX) 40 MG tablet Take 40 mg by mouth daily. 07/19/12  Yes June Leap, MD  glipiZIDE (GLUCOTROL XL) 5 MG 24 hr tablet Take 5 mg by mouth daily.   Yes Historical Provider, MD  lactulose (CHRONULAC) 10 GM/15ML solution Take 15 mLs by mouth 2 (two) times daily.  06/13/12  Yes Historical Provider, MD  lisinopril (PRINIVIL,ZESTRIL) 20 MG tablet Take 20 mg by mouth daily.   Yes Historical Provider, MD  metFORMIN (GLUCOPHAGE-XR) 500 MG 24 hr tablet Take 1,000 mg by mouth Twice daily.  04/07/11  Yes Historical Provider, MD  metoprolol tartrate (LOPRESSOR) 25 MG tablet Take 25 mg by mouth 2 (two) times daily. 06/16/12  Yes Iran Ouch, MD  omeprazole (PRILOSEC) 20 MG capsule Take 20 mg by mouth daily. 06/16/12  Yes Iran Ouch, MD   potassium chloride SA (K-DUR,KLOR-CON) 20 MEQ tablet Take 20 mEq by mouth daily. 06/16/12  Yes Iran Ouch, MD   Physical Exam: Filed Vitals:   02/02/13 1633 02/02/13 1822  BP: 168/74 134/55  Pulse: 83 76  Temp: 98.8 F (37.1 C)   TempSrc: Oral   SpO2: 99% 98%    BP 134/55  Pulse 76  Temp(Src) 98.8 F (37.1 C) (Oral)  SpO2 98%  General Appearance:    Alert, cooperative, no distress, appears stated age  Head:    Normocephalic, without obvious abnormality, atraumatic  Eyes:    PERRL, conjunctiva/corneas clear, EOM's intact, fundi    benign, both eyes              Throat: poor oral hygiene   Neck:   Supple, symmetrical, trachea midline, no adenopathy;       thyroid:  No enlargement/tenderness/nodules; no carotid   bruit or JVD  Back:     Symmetric, no curvature, ROM normal, no CVA tenderness  Lungs:     Clear to auscultation bilaterally, respirations unlabored  Chest wall:    No tenderness or deformity gynecomastia   Heart:    Regular rate and rhythm, S1 and S2 normal, no murmur, rub   or gallop  Abdomen:     Soft, non-tender, bowel sounds active all four quadrants,    no masses, no organomegaly        Extremities:   right lower extremity is shorter than the left and is externally rotated   Pulses:   2+ and symmetric all extremities  Skin:   Skin color, texture, turgor normal, no rashes or lesions  Lymph nodes:   Cervical, supraclavicular, and axillary nodes normal  Neurologic:   CNII-XII intact. Normal strength, sensation and reflexes      throughout    Labs on Admission:  Basic Metabolic Panel:  Recent Labs Lab 02/02/13 1618  NA 143  K 3.7  CL 106  CO2 25  GLUCOSE 238*  BUN 14  CREATININE 0.57  CALCIUM 9.5   Liver Function Tests: No results found for this basename: AST, ALT, ALKPHOS, BILITOT, PROT, ALBUMIN,  in the last 168 hours No results found for this basename: LIPASE, AMYLASE,  in the last 168 hours No results found for this basename: AMMONIA,   in the last 168 hours CBC:  Recent Labs Lab 02/02/13 1618  WBC 8.6  NEUTROABS 6.6  HGB 13.1  HCT 37.6*  MCV 94.5  PLT 134*   Cardiac Enzymes: No results found for this basename: CKTOTAL, CKMB, CKMBINDEX, TROPONINI,  in the last 168 hours  BNP (last 3 results) No results found for this basename: PROBNP,  in the last 8760 hours CBG: No results found for this basename: GLUCAP,  in the last 168 hours  Radiological Exams on Admission: Dg Ribs Unilateral W/chest Right  02/02/2013  *RADIOLOGY REPORT*  Clinical Data: Fall with chest pain.  RIGHT RIBS AND CHEST - 3+ VIEW  Comparison: 04/26/2012 and  prior chest radiographs  Findings: Cardiomegaly and CABG changes noted. Bibasilar atelectasis identified with scattered areas of unchanged pulmonary scarring. There are nondisplaced fractures of the right 4th - 7th ribs and are age indeterminate. There is no evidence of pleural effusion or pneumothorax. Remote appearing left 4th-8th rib fractures are present.  IMPRESSION: Nondisplaced right fourth - seventh rib fractures, age indeterminate.  Remote appearing fractures of the left 4th - 8th ribs but correlate clinically with pain.  Bibasilar atelectasis.   Original Report Authenticated By: Harmon Pier, M.D.    Dg Hip Complete Right  02/02/2013  *RADIOLOGY REPORT*  Clinical Data: Larey Seat, pain  RIGHT HIP - COMPLETE 2+ VIEW  Comparison: None.  Findings: Intertrochanteric fracture of the right hip.  Slight comminution.  Slight widening of the proximal aspect of the fracture.  No pelvic fractures.  Small fecal impaction.  IMPRESSION: Acute intertrochanteric fracture of the right hip.   Original Report Authenticated By: Davonna Belling, M.D.    Dg Knee 1-2 Views Right  02/02/2013  *RADIOLOGY REPORT*  Clinical Data: Fall with right knee pain.  RIGHT KNEE - 1-2 VIEW  Comparison: None  Findings: No acute fracture, subluxation or dislocation identified. No definite effusion is identified. No focal bony lesions are  present. Mild tricompartmental joint space narrowing identified.  IMPRESSION: No evidence of acute bony abnormality.   Original Report Authenticated By: Harmon Pier, M.D.    Ct Head Wo Contrast  02/02/2013  *RADIOLOGY REPORT*  Clinical Data: Larey Seat without loss of consciousness.  CT HEAD WITHOUT CONTRAST  Technique:  Contiguous axial images were obtained from the base of the skull through the vertex without contrast.  Comparison: 02/24/2005.  Findings: There is no evidence for acute infarction, intracranial hemorrhage, mass lesion, hydrocephalus, or extra-axial fluid.  Mild atrophy.  Mild chronic microvascular ischemic change.  Calvarium intact.  Advanced vascular calcification.  There is a hyperdense retention cyst in the central compartment of the sphenoid sinus measuring 21 x 24 mm cross-section.  Variable areas of increased attenuation could represent inspissated mucus, fungal infection, or even calcification. This retention cyst does not fill the sinus at this time and there are no features to suggest a mucocele.  Mild chronic sinus disease can be seen in both maxillary sinuses, and the posterior right ethmoids.  Compared with priors, atrophy has progressed.  IMPRESSION: Mild atrophy and chronic microvascular ischemic change.  There is no skull fracture or intracranial hemorrhage.  Chronic sinusitis as described.   Original Report Authenticated By: Davonna Belling, M.D.     EKG: Normal sinus rhythm no T wave inversion T wave flattening in V4 through V2. Normal axis  Assessment/Plan Principal Problem:  Fracture, intertrochanteric, right femur: - Orthopedic surgeon wants to proceed with surgery tomorrow morning. - preop evaluation: I discussed with the patient his risk and benefits, he has a grade 1 diastolic heart failure with an EF of 50%. Is currently on aspirin, I will hold. We'll continue his metoprolol. His creatinine seems to be at baseline. He does liver disease, with a PT of 16, he also has mild  thrombocytopenia with a platelet count of 134. He relates no shortness of breath or chest pain. His EKG does not show any arrhythmias. His exercise capacity is 4  MET's. Given his comorbidities he is moderate risk for cardiopulmonary complications. I have discussed with the patient risk and benefits and he agrees to proceed with surgery.  - Heparin for DVT prophylaxis, after surgery and patient is started to mobilize can do  SCDs.  - Patient has a past medical history of cirrhosis with a portocaval shunt, he said his last drink was more than 10 years ago. Will start him on thiamine and folate. His LFTs have not been checked we'll go ahead and check these.  - UDS  Type 2 diabetes mellitus - He is currently on metformin. Although it and hold this. Also on sliding scale insulin. He'll be n.p.o. after midnight.    Essential hypertension, benign: - Hole disease continue his beta blocker and his Lasix. This could be resumed after surgery.    Atrial fibrillation: - He is in sinus rhythm today and rate control continue beta blockers.  Cardiology  Code Status: full Family Communication: none Disposition Plan: SNF 3-4 days  Time spent: 60 minutes  Marinda Elk [facility] Pager [phone]  If 7PM-7AM, please contact night-coverage [email or web address] Password [facility] 02/02/2013, 6:37 PM

## 2013-02-02 NOTE — Consult Note (Signed)
Reason for Consult: Right hip fracture Referring Physician: EDP  CLYDE ZARRELLA is an 72 y.o. male.  HPI: Anthony Page today. Trip. No LOC  Past Medical History  Diagnosis Date  . Coronary atherosclerosis of native coronary artery     Multivessel status post CABG  . Cirrhosis   . Paroxysmal atrial fibrillation     Postoperative  . GERD (gastroesophageal reflux disease)   . Essential hypertension, benign   . DM2 (diabetes mellitus, type 2)   . Dyslipidemia   . Meniere disease     Partial deafness  . Back fracture     Severe fractures of the feet and back from traumatic fall w/long-term disabilites    Past Surgical History  Procedure Laterality Date  . Coronary artery bypass graft  2011    LIMA to LAD, SVG to diagonal, SVG to OM, SVG to RCA  . Portacaval shunt    . Left inguinal hernia      Family History  Problem Relation Age of Onset  . Diabetes Brother   . Cancer Brother   . Cancer Sister   . Heart disease Brother     Social History:  reports that he quit smoking about 23 years ago. His smoking use included Cigarettes and Cigars. He has a 40 pack-year smoking history. He has never used smokeless tobacco. He reports that he does not drink alcohol or use illicit drugs.  Allergies:  Allergies  Allergen Reactions  . Codeine   . Penicillins     Medications: I have reviewed the patient's current medications.  Results for orders placed during the hospital encounter of 02/02/13 (from the past 48 hour(s))  BASIC METABOLIC PANEL     Status: Abnormal   Collection Time    02/02/13  4:18 PM      Result Value Range   Sodium 143  135 - 145 mEq/L   Potassium 3.7  3.5 - 5.1 mEq/L   Chloride 106  96 - 112 mEq/L   CO2 25  19 - 32 mEq/L   Glucose, Bld 238 (*) 70 - 99 mg/dL   BUN 14  6 - 23 mg/dL   Creatinine, Ser 1.61  0.50 - 1.35 mg/dL   Calcium 9.5  8.4 - 09.6 mg/dL   GFR calc non Af Amer >90  >90 mL/min   GFR calc Af Amer >90  >90 mL/min   Comment:            The eGFR has  been calculated     using the CKD EPI equation.     This calculation has not been     validated in all clinical     situations.     eGFR's persistently     <90 mL/min signify     possible Chronic Kidney Disease.  CBC WITH DIFFERENTIAL     Status: Abnormal   Collection Time    02/02/13  4:18 PM      Result Value Range   WBC 8.6  4.0 - 10.5 K/uL   RBC 3.98 (*) 4.22 - 5.81 MIL/uL   Hemoglobin 13.1  13.0 - 17.0 g/dL   HCT 04.5 (*) 40.9 - 81.1 %   MCV 94.5  78.0 - 100.0 fL   MCH 32.9  26.0 - 34.0 pg   MCHC 34.8  30.0 - 36.0 g/dL   RDW 91.4  78.2 - 95.6 %   Platelets 134 (*) 150 - 400 K/uL   Neutrophils Relative 77  43 - 77 %  Neutro Abs 6.6  1.7 - 7.7 K/uL   Lymphocytes Relative 13  12 - 46 %   Lymphs Abs 1.1  0.7 - 4.0 K/uL   Monocytes Relative 10  3 - 12 %   Monocytes Absolute 0.9  0.1 - 1.0 K/uL   Eosinophils Relative 0  0 - 5 %   Eosinophils Absolute 0.0  0.0 - 0.7 K/uL   Basophils Relative 0  0 - 1 %   Basophils Absolute 0.0  0.0 - 0.1 K/uL  PROTIME-INR     Status: Abnormal   Collection Time    02/02/13  4:18 PM      Result Value Range   Prothrombin Time 16.0 (*) 11.6 - 15.2 seconds   INR 1.31  0.00 - 1.49  TYPE AND SCREEN     Status: None   Collection Time    02/02/13  4:25 PM      Result Value Range   ABO/RH(D) O NEG     Antibody Screen NEG     Sample Expiration 02/05/2013      Dg Ribs Unilateral W/chest Right  02/02/2013  *RADIOLOGY REPORT*  Clinical Data: Fall with chest pain.  RIGHT RIBS AND CHEST - 3+ VIEW  Comparison: 04/26/2012 and prior chest radiographs  Findings: Cardiomegaly and CABG changes noted. Bibasilar atelectasis identified with scattered areas of unchanged pulmonary scarring. There are nondisplaced fractures of the right 4th - 7th ribs and are age indeterminate. There is no evidence of pleural effusion or pneumothorax. Remote appearing left 4th-8th rib fractures are present.  IMPRESSION: Nondisplaced right fourth - seventh rib fractures, age  indeterminate.  Remote appearing fractures of the left 4th - 8th ribs but correlate clinically with pain.  Bibasilar atelectasis.   Original Report Authenticated By: Harmon Pier, M.D.    Dg Hip Complete Right  02/02/2013  *RADIOLOGY REPORT*  Clinical Data: Anthony Page, pain  RIGHT HIP - COMPLETE 2+ VIEW  Comparison: None.  Findings: Intertrochanteric fracture of the right hip.  Slight comminution.  Slight widening of the proximal aspect of the fracture.  No pelvic fractures.  Small fecal impaction.  IMPRESSION: Acute intertrochanteric fracture of the right hip.   Original Report Authenticated By: Davonna Belling, M.D.    Dg Knee 1-2 Views Right  02/02/2013  *RADIOLOGY REPORT*  Clinical Data: Fall with right knee pain.  RIGHT KNEE - 1-2 VIEW  Comparison: None  Findings: No acute fracture, subluxation or dislocation identified. No definite effusion is identified. No focal bony lesions are present. Mild tricompartmental joint space narrowing identified.  IMPRESSION: No evidence of acute bony abnormality.   Original Report Authenticated By: Harmon Pier, M.D.    Ct Head Wo Contrast  02/02/2013  *RADIOLOGY REPORT*  Clinical Data: Anthony Page without loss of consciousness.  CT HEAD WITHOUT CONTRAST  Technique:  Contiguous axial images were obtained from the base of the skull through the vertex without contrast.  Comparison: 02/24/2005.  Findings: There is no evidence for acute infarction, intracranial hemorrhage, mass lesion, hydrocephalus, or extra-axial fluid.  Mild atrophy.  Mild chronic microvascular ischemic change.  Calvarium intact.  Advanced vascular calcification.  There is a hyperdense retention cyst in the central compartment of the sphenoid sinus measuring 21 x 24 mm cross-section.  Variable areas of increased attenuation could represent inspissated mucus, fungal infection, or even calcification. This retention cyst does not fill the sinus at this time and there are no features to suggest a mucocele.  Mild chronic sinus  disease can be seen in  both maxillary sinuses, and the posterior right ethmoids.  Compared with priors, atrophy has progressed.  IMPRESSION: Mild atrophy and chronic microvascular ischemic change.  There is no skull fracture or intracranial hemorrhage.  Chronic sinusitis as described.   Original Report Authenticated By: Davonna Belling, M.D.     Review of Systems  Musculoskeletal: Positive for joint pain and falls.  All other systems reviewed and are negative.   Blood pressure 168/74, pulse 83, temperature 98.8 F (37.1 C), temperature source Oral, SpO2 99.00%. Physical Exam  Vitals reviewed. Constitutional: He is oriented to person, place, and time. He appears well-developed.  HENT:  Head: Normocephalic.  Eyes: Pupils are equal, round, and reactive to light.  Neck: Normal range of motion.  Cardiovascular: Normal rate.   Respiratory: Effort normal.  GI: Soft. Bowel sounds are normal.  Musculoskeletal:  Pain with ROM right hip. NVI.  Neurological: He is alert and oriented to person, place, and time.  Skin: Skin is warm and dry.  Psychiatric: He has a normal mood and affect.    Assessment/Plan: Right Intertrochanteric hip fracture. Plan Im nailing after medical clearance.  Chen Holzman C 02/02/2013, 5:57 PM

## 2013-02-02 NOTE — ED Notes (Signed)
Ordered diet tray 

## 2013-02-02 NOTE — ED Provider Notes (Signed)
History     CSN: [id number]  Arrival date & time 02/02/13  1552   First MD Initiated Contact with Patient 02/02/13 1604      Chief Complaint  Patient presents with  . Fall    (Consider location/radiation/quality/duration/timing/severity/associated sxs/prior treatment) Patient is a 72 y.o. male presenting with fall.  Fall   Pt with history of multiple medical problems reports he had a mechanical fall earlier today at home, landing on his R side. Complaining of moderate aching R knee and hip pain, worse with movement. Also has R rib pain. Denies headache now, but states he hit his head and was unconscious for a brief time. Brought to the ED by EMS.   Past Medical History  Diagnosis Date  . Coronary atherosclerosis of native coronary artery     Multivessel status post CABG  . Cirrhosis   . Paroxysmal atrial fibrillation     Postoperative  . GERD (gastroesophageal reflux disease)   . Essential hypertension, benign   . DM2 (diabetes mellitus, type 2)   . Dyslipidemia   . Meniere disease     Partial deafness  . Back fracture     Severe fractures of the feet and back from traumatic fall w/long-term disabilites    Past Surgical History  Procedure Laterality Date  . Coronary artery bypass graft  2011    LIMA to LAD, SVG to diagonal, SVG to OM, SVG to RCA  . Portacaval shunt    . Left inguinal hernia      Family History  Problem Relation Age of Onset  . Diabetes Brother   . Cancer Brother   . Cancer Sister   . Heart disease Brother     History  Substance Use Topics  . Smoking status: Former Smoker -- 2.00 packs/day for 20 years    Types: Cigarettes, Cigars    Quit date: 06/16/1989  . Smokeless tobacco: Never Used     Comment: tobacco use-no  . Alcohol Use: No      Review of Systems All other systems reviewed and are negative except as noted in HPI.   Allergies  Codeine and Penicillins  Home Medications   Current Outpatient Rx  Name  Route  Sig   Dispense  Refill  . aspirin EC 81 MG tablet   Oral   Take 81 mg by mouth daily.         Marland Kitchen escitalopram (LEXAPRO) 10 MG tablet   Oral   Take 10 mg by mouth Daily.          . folic acid (FOLVITE) 1 MG tablet   Oral   Take 1 mg by mouth daily.         . furosemide (LASIX) 40 MG tablet   Oral   Take 40 mg by mouth daily.         Marland Kitchen glipiZIDE (GLUCOTROL XL) 5 MG 24 hr tablet   Oral   Take 5 mg by mouth daily.         Marland Kitchen lactulose (CHRONULAC) 10 GM/15ML solution   Oral   Take 15 mLs by mouth 2 (two) times daily.          Marland Kitchen lisinopril (PRINIVIL,ZESTRIL) 20 MG tablet   Oral   Take 20 mg by mouth daily.         . metFORMIN (GLUCOPHAGE-XR) 500 MG 24 hr tablet   Oral   Take 1,000 mg by mouth Twice daily.          Marland Kitchen  metoprolol tartrate (LOPRESSOR) 25 MG tablet   Oral   Take 25 mg by mouth 2 (two) times daily.         Marland Kitchen omeprazole (PRILOSEC) 20 MG capsule   Oral   Take 20 mg by mouth daily.         . potassium chloride SA (K-DUR,KLOR-CON) 20 MEQ tablet   Oral   Take 20 mEq by mouth daily.           There were no vitals taken for this visit.  Physical Exam  Nursing note and vitals reviewed. Constitutional: He is oriented to person, place, and time. He appears well-developed and well-nourished.  HENT:  Head: Normocephalic and atraumatic.  Eyes: EOM are normal. Pupils are equal, round, and reactive to light.  Neck: Normal range of motion. Neck supple.  Cardiovascular: Normal rate, normal heart sounds and intact distal pulses.   Pulmonary/Chest: Effort normal and breath sounds normal. He exhibits tenderness.  Abdominal: Bowel sounds are normal. He exhibits no distension. There is no tenderness.  Musculoskeletal: He exhibits tenderness (tender to palpation R hip, no tenderness or swelling R knee; R leg shortened and in external rotation). He exhibits no edema.  Neurological: He is alert and oriented to person, place, and time. He has normal strength. No  cranial nerve deficit or sensory deficit.  Skin: Skin is warm and dry. No rash noted.  Psychiatric: He has a normal mood and affect.    ED Course  Procedures (including critical care time)  Labs Reviewed  BASIC METABOLIC PANEL - Abnormal; Notable for the following:    Glucose, Bld 238 (*)    All other components within normal limits  CBC WITH DIFFERENTIAL - Abnormal; Notable for the following:    RBC 3.98 (*)    HCT 37.6 (*)    Platelets 134 (*)    All other components within normal limits  PROTIME-INR - Abnormal; Notable for the following:    Prothrombin Time 16.0 (*)    All other components within normal limits  TYPE AND SCREEN   Dg Ribs Unilateral W/chest Right  02/02/2013  *RADIOLOGY REPORT*  Clinical Data: Fall with chest pain.  RIGHT RIBS AND CHEST - 3+ VIEW  Comparison: 04/26/2012 and prior chest radiographs  Findings: Cardiomegaly and CABG changes noted. Bibasilar atelectasis identified with scattered areas of unchanged pulmonary scarring. There are nondisplaced fractures of the right 4th - 7th ribs and are age indeterminate. There is no evidence of pleural effusion or pneumothorax. Remote appearing left 4th-8th rib fractures are present.  IMPRESSION: Nondisplaced right fourth - seventh rib fractures, age indeterminate.  Remote appearing fractures of the left 4th - 8th ribs but correlate clinically with pain.  Bibasilar atelectasis.   Original Report Authenticated By: Harmon Pier, M.D.    Dg Hip Complete Right  02/02/2013  *RADIOLOGY REPORT*  Clinical Data: Larey Seat, pain  RIGHT HIP - COMPLETE 2+ VIEW  Comparison: None.  Findings: Intertrochanteric fracture of the right hip.  Slight comminution.  Slight widening of the proximal aspect of the fracture.  No pelvic fractures.  Small fecal impaction.  IMPRESSION: Acute intertrochanteric fracture of the right hip.   Original Report Authenticated By: Davonna Belling, M.D.    Dg Knee 1-2 Views Right  02/02/2013  *RADIOLOGY REPORT*  Clinical  Data: Fall with right knee pain.  RIGHT KNEE - 1-2 VIEW  Comparison: None  Findings: No acute fracture, subluxation or dislocation identified. No definite effusion is identified. No focal bony lesions are present.  Mild tricompartmental joint space narrowing identified.  IMPRESSION: No evidence of acute bony abnormality.   Original Report Authenticated By: Harmon Pier, M.D.    Ct Head Wo Contrast  02/02/2013  *RADIOLOGY REPORT*  Clinical Data: Larey Seat without loss of consciousness.  CT HEAD WITHOUT CONTRAST  Technique:  Contiguous axial images were obtained from the base of the skull through the vertex without contrast.  Comparison: 02/24/2005.  Findings: There is no evidence for acute infarction, intracranial hemorrhage, mass lesion, hydrocephalus, or extra-axial fluid.  Mild atrophy.  Mild chronic microvascular ischemic change.  Calvarium intact.  Advanced vascular calcification.  There is a hyperdense retention cyst in the central compartment of the sphenoid sinus measuring 21 x 24 mm cross-section.  Variable areas of increased attenuation could represent inspissated mucus, fungal infection, or even calcification. This retention cyst does not fill the sinus at this time and there are no features to suggest a mucocele.  Mild chronic sinus disease can be seen in both maxillary sinuses, and the posterior right ethmoids.  Compared with priors, atrophy has progressed.  IMPRESSION: Mild atrophy and chronic microvascular ischemic change.  There is no skull fracture or intracranial hemorrhage.  Chronic sinusitis as described.   Original Report Authenticated By: Davonna Belling, M.D.      No diagnosis found.    MDM  Concern for hip fracture. Order set initialized with empiric pre-op eval. Parenteral pain medications ordered.    Date: 02/02/2013  Rate: 80  Rhythm: normal sinus rhythm  QRS Axis: normal  Intervals: normal  ST/T Wave abnormalities: nonspecific T wave changes  Conduction Disutrbances:none   Narrative Interpretation:   Old EKG Reviewed: unchanged  6:15 PM Xrays as above show hip fracture and rib fractures. Discussed with Dr. Shelle Iron on call for Ortho who will evaluate the patient. He anticipates ORIF tomorrow to allow for medical clearance. Hospitalist to admit.         Charles B. Bernette Mayers, MD 02/02/13 1815

## 2013-02-02 NOTE — ED Notes (Signed)
Patient transported to CT 

## 2013-02-02 NOTE — Consult Note (Signed)
Primary cardiologist: Dr. Nona Dell (as of 1/13)  Clinical Summary Anthony Page is a 72 y.o.male with a history of multivessel CAD status post CABG, PAF (postoperative), previously followed by Dr.DeGent. I saw him for a routine visit in January at which time he was clinically stable from a cardiac perspective. He is now admitted to the hospital after an accidental fall with subsequent right intertrochanteric femur fracture. He is to go to the OR tomorrow. We are consulted to assist with his management this evening.  At baseline he is functionally limited although is able to achieve activity of at least for METS, has had no complaint of angina recently, no obvious palpitations or definitive arrhythmia. ECG shows sinus rhythm with decreased R-wave progression and nonspecific ST-T changes. LVEF 55-60% based on previous assessment 2011.  He states that he was walking into his house and tripped over the threshold when he fell resulting in hip fracture. No syncope. He states that at baseline he has been unstable on his feet, slipped in the recent snow as well. Of note, he has not been anticoagulated with previous history of postoperative PAF.   Allergies  Allergen Reactions  . Codeine   . Penicillins     Medications . [START ON 02/03/2013] clindamycin (CLEOCIN) IV  900 mg Intravenous Q8H    Past Medical History  Diagnosis Date  . Coronary atherosclerosis of native coronary artery     Multivessel status post CABG  . Cirrhosis   . Paroxysmal atrial fibrillation     Postoperative  . GERD (gastroesophageal reflux disease)   . Essential hypertension, benign   . DM2 (diabetes mellitus, type 2)   . Dyslipidemia   . Meniere disease     Partial deafness  . Back fracture     Severe fractures of the feet and back from traumatic fall w/long-term disabilites    Past Surgical History  Procedure Laterality Date  . Coronary artery bypass graft  2011    LIMA to LAD, SVG to diagonal, SVG to OM,  SVG to RCA  . Portacaval shunt    . Left inguinal hernia      Family History  Problem Relation Age of Onset  . Diabetes Brother   . Cancer Brother   . Cancer Sister   . Heart disease Brother   . Heart disease Mother     Social History [name] reports that he quit smoking about 23 years ago. His smoking use included Cigarettes and Cigars. He has a 40 pack-year smoking history. He has never used smokeless tobacco. [name] reports that he does not drink alcohol.  Review of Systems Unsteady on his feet. Has resting/intention tremor probably involving the left side. Denies any bleeding episodes. Stable appetite. Hard of hearing. No orthopnea or PND. Otherwise negative.  Physical Examination Blood pressure 116/93, pulse 98, temperature 98.2 F (36.8 C), temperature source Oral, resp. rate 18, height 5\' 6"  (1.676 m), SpO2 100.00%.  No acute distress.  HEENT: Conjunctiva and lids normal, oropharynx clear with poor dentition.  Neck: Supple, no elevated JVP or carotid bruits, no thyromegaly.  Lungs: Diminished but clear to auscultation, nonlabored breathing at rest.  Cardiac: Regular rate and rhythm, no S3, soft systolic murmur, no pericardial rub.  Abdomen: Soft, nontender, protuberant, bowel sounds present.  Extremities: No pitting edema, distal pulses 1+. Venous stasis.  Skin: Warm and dry.  Musculoskeletal: No kyphosis.  Neuropsychiatric: Alert and oriented x3, affect grossly appropriate.   Lab Results Basic Metabolic Panel:  Recent Labs Lab 02/02/13 1618  NA 143  K 3.7  CL 106  CO2 25  GLUCOSE 238*  BUN 14  CREATININE 0.57  CALCIUM 9.5   CBC:  Recent Labs Lab 02/02/13 1618  WBC 8.6  NEUTROABS 6.6  HGB 13.1  HCT 37.6*  MCV 94.5  PLT 134*    Imaging CT HEAD WITHOUT CONTRAST  Technique: Contiguous axial images were obtained from the base of the skull through the vertex without contrast.  Comparison: 02/24/2005.  Findings: There is no evidence for  acute infarction, intracranial hemorrhage, mass lesion, hydrocephalus, or extra-axial fluid. Mild atrophy. Mild chronic microvascular ischemic change. Calvarium intact. Advanced vascular calcification.  There is a hyperdense retention cyst in the central compartment of the sphenoid sinus measuring 21 x 24 mm cross-section. Variable areas of increased attenuation could represent inspissated mucus, fungal infection, or even calcification. This retention cyst does not fill the sinus at this time and there are no features to suggest a mucocele. Mild chronic sinus disease can be seen in both maxillary sinuses, and the posterior right ethmoids.  Compared with priors, atrophy has progressed.  IMPRESSION: Mild atrophy and chronic microvascular ischemic change. There is no skull fracture or intracranial hemorrhage.  Chronic sinusitis as described.  RIGHT RIBS AND CHEST - 3+ VIEW  Comparison: 04/26/2012 and prior chest radiographs  Findings: Cardiomegaly and CABG changes noted. Bibasilar atelectasis identified with scattered areas of unchanged pulmonary scarring. There are nondisplaced fractures of the right 4th - 7th ribs and are age indeterminate. There is no evidence of pleural effusion or pneumothorax. Remote appearing left 4th-8th rib fractures are present.  IMPRESSION: Nondisplaced right fourth - seventh rib fractures, age indeterminate.  Remote appearing fractures of the left 4th - 8th ribs but correlate clinically with pain.  Bibasilar atelectasis.  Impression  1. Preoperative assessment prior to right hip surgery, scheduled for tomorrow. Patient is status post accidental fall with subsequent right intertrochanteric femur fracture. From a cardiac perspective he has been stable, no active angina or obvious CHF symptoms, no palpitations. ECG shows sinus rhythm. Anticipate that he should be able to proceed with planned surgery an acceptable perioperative cardiac risk. Our  service will follow with you.  2. History of multivessel CAD status post CABG. Outpatient medical regimen includes aspirin, Lasix, potassium, Prinivil, and Lopressor. Would continue present cardiac regimen throughout.  3. History of PAF in the postoperative setting. Currently in sinus rhythm. Suggest telemetry monitoring as he could have perioperative atrial arrhythmias. Continue beta blocker as noted.  Recommendations  As noted above, would continue present outpatient cardiac regimen, and patient should be able to proceed with planned hip surgery at an acceptable perioperative cardiac risk. Check postoperative ECG, also telemetry in light of prior history of PAF in the postoperative setting. Our service willl follow with you.   Jonelle Sidle, M.D., F.A.C.C.

## 2013-02-03 ENCOUNTER — Encounter (HOSPITAL_COMMUNITY): Payer: Self-pay | Admitting: Anesthesiology

## 2013-02-03 ENCOUNTER — Inpatient Hospital Stay (HOSPITAL_COMMUNITY): Payer: Medicare Other | Admitting: Anesthesiology

## 2013-02-03 ENCOUNTER — Inpatient Hospital Stay (HOSPITAL_COMMUNITY): Payer: Medicare Other

## 2013-02-03 ENCOUNTER — Encounter (HOSPITAL_COMMUNITY)
Admission: EM | Disposition: A | Discharge status: Skilled Nursing Facility | Payer: Self-pay | Source: Home / Self Care | Attending: Internal Medicine

## 2013-02-03 HISTORY — PX: FEMUR IM NAIL: SHX1597

## 2013-02-03 LAB — CBC
HCT: 32.3 % — ABNORMAL LOW (ref 39.0–52.0)
MCH: 32.6 pg (ref 26.0–34.0)
MCH: 32.1 pg (ref 26.0–34.0)
MCHC: 34.7 g/dL (ref 30.0–36.0)
MCHC: 32.7 g/dL (ref 30.0–36.0)
Platelets: 123 10*3/uL — ABNORMAL LOW (ref 150–400)
RBC: 3.46 MIL/uL — ABNORMAL LOW (ref 4.22–5.81)
RDW: 14.4 % (ref 11.5–15.5)

## 2013-02-03 LAB — COMPREHENSIVE METABOLIC PANEL
ALT: 9 U/L (ref 0–53)
Alkaline Phosphatase: 74 U/L (ref 39–117)
CO2: 26 mEq/L (ref 19–32)
GFR calc Af Amer: >90 mL/min (ref >90)
GFR calc non Af Amer: >90 mL/min (ref >90)
Glucose, Bld: 160 mg/dL — ABNORMAL HIGH (ref 70–99)
Potassium: 3.4 mEq/L — ABNORMAL LOW (ref 3.5–5.1)
Sodium: 145 mEq/L (ref 135–145)
Total Protein: 5.5 g/dL — ABNORMAL LOW (ref 6.0–8.3)

## 2013-02-03 LAB — TYPE AND SCREEN
ABO/RH(D): O NEG
Antibody Screen: NEGATIVE

## 2013-02-03 LAB — FOLATE RBC: RBC Folate: 1279 ng/mL — ABNORMAL HIGH (ref >=366)

## 2013-02-03 LAB — PROTIME-INR: INR: 1.52 — ABNORMAL HIGH (ref 0.00–1.49)

## 2013-02-03 LAB — HEMOGLOBIN A1C: Mean Plasma Glucose: 154 mg/dL — ABNORMAL HIGH (ref <117)

## 2013-02-03 LAB — SURGICAL PCR SCREEN
MRSA, PCR: NEGATIVE
Staphylococcus aureus: NEGATIVE

## 2013-02-03 LAB — GLUCOSE, CAPILLARY
Glucose-Capillary: 144 mg/dL — ABNORMAL HIGH (ref 70–99)
Glucose-Capillary: 103 mg/dL — ABNORMAL HIGH (ref 70–99)

## 2013-02-03 LAB — CREATININE, SERUM: Creatinine, Ser: 0.62 mg/dL (ref 0.50–1.35)

## 2013-02-03 SURGERY — INSERTION, INTRAMEDULLARY ROD, FEMUR
Anesthesia: General | Laterality: Right | Wound class: Clean

## 2013-02-03 MED ORDER — LACTATED RINGERS IV SOLN
INTRAVENOUS | Status: DC
  Administered 2013-02-03: 1000 mL via INTRAVENOUS

## 2013-02-03 MED ORDER — 0.9 % SODIUM CHLORIDE (POUR BTL) OPTIME
TOPICAL | Status: DC | PRN
  Administered 2013-02-03: 1000 mL

## 2013-02-03 MED ORDER — ATORVASTATIN CALCIUM 20 MG PO TABS
20.0000 mg | ORAL_TABLET | Freq: Every day | ORAL | Status: DC
  Filled 2013-02-03: qty 1

## 2013-02-03 MED ORDER — PHENOL 1.4 % MT LIQD
1.0000 | OROMUCOSAL | Status: DC | PRN
  Filled 2013-02-03: qty 177

## 2013-02-03 MED ORDER — CLINDAMYCIN PHOSPHATE 900 MG/50ML IV SOLN
900.0000 mg | Freq: Four times a day (QID) | INTRAVENOUS | Status: AC
  Administered 2013-02-03 – 2013-02-04 (×2): 900 mg via INTRAVENOUS
  Filled 2013-02-03 (×2): qty 50

## 2013-02-03 MED ORDER — EPHEDRINE SULFATE 50 MG/ML IJ SOLN
INTRAMUSCULAR | Status: DC | PRN
  Administered 2013-02-03: 5 mg via INTRAVENOUS

## 2013-02-03 MED ORDER — ONDANSETRON HCL 4 MG/2ML IJ SOLN: 4.0000 mg | Freq: Four times a day (QID) | INTRAMUSCULAR | Status: DC | PRN

## 2013-02-03 MED ORDER — ACETAMINOPHEN 325 MG PO TABS: 650.0000 mg | ORAL_TABLET | Freq: Four times a day (QID) | ORAL | Status: DC | PRN

## 2013-02-03 MED ORDER — POTASSIUM CL IN DEXTROSE 5% 20 MEQ/L IV SOLN
20.0000 meq | INTRAVENOUS | Status: DC
  Administered 2013-02-04: 20 meq via INTRAVENOUS
  Filled 2013-02-03 (×2): qty 1000

## 2013-02-03 MED ORDER — PHENYLEPHRINE HCL 10 MG/ML IJ SOLN
INTRAMUSCULAR | Status: DC | PRN
  Administered 2013-02-03 (×3): 40 ug via INTRAVENOUS

## 2013-02-03 MED ORDER — SUCCINYLCHOLINE CHLORIDE 20 MG/ML IJ SOLN
INTRAMUSCULAR | Status: DC | PRN
  Administered 2013-02-03: 100 mg via INTRAVENOUS

## 2013-02-03 MED ORDER — HYDROMORPHONE HCL PF 1 MG/ML IJ SOLN
1.0000 mg | INTRAMUSCULAR | Status: DC | PRN
  Administered 2013-02-03 – 2013-02-04 (×3): 1 mg via INTRAVENOUS
  Filled 2013-02-03 (×3): qty 1

## 2013-02-03 MED ORDER — ETOMIDATE 2 MG/ML IV SOLN
INTRAVENOUS | Status: DC | PRN
  Administered 2013-02-03: 8 mg via INTRAVENOUS

## 2013-02-03 MED ORDER — PROPOFOL 10 MG/ML IV BOLUS
INTRAVENOUS | Status: DC | PRN
  Administered 2013-02-03: 60 mg via INTRAVENOUS

## 2013-02-03 MED ORDER — LACTATED RINGERS IV SOLN
INTRAVENOUS | Status: DC | PRN
  Administered 2013-02-03 (×2): via INTRAVENOUS

## 2013-02-03 MED ORDER — ACETAMINOPHEN 650 MG RE SUPP: 650.0000 mg | Freq: Four times a day (QID) | RECTAL | Status: DC | PRN

## 2013-02-03 MED ORDER — ENOXAPARIN SODIUM 30 MG/0.3ML ~~LOC~~ SOLN
30.0000 mg | SUBCUTANEOUS | Status: DC
  Administered 2013-02-04 – 2013-02-05 (×2): 30 mg via SUBCUTANEOUS
  Filled 2013-02-03 (×4): qty 0.3

## 2013-02-03 MED ORDER — ONDANSETRON HCL 4 MG/2ML IJ SOLN
INTRAMUSCULAR | Status: DC | PRN
  Administered 2013-02-03: 4 mg via INTRAVENOUS

## 2013-02-03 MED ORDER — HYDROCODONE-ACETAMINOPHEN 5-325 MG PO TABS
1.0000 | ORAL_TABLET | Freq: Four times a day (QID) | ORAL | Status: DC | PRN
  Administered 2013-02-04: 1 via ORAL
  Filled 2013-02-03: qty 1

## 2013-02-03 MED ORDER — HYDROMORPHONE HCL PF 1 MG/ML IJ SOLN: 0.1000 mg | INTRAMUSCULAR | Status: DC | PRN

## 2013-02-03 MED ORDER — ONDANSETRON HCL 4 MG PO TABS: 4.0000 mg | ORAL_TABLET | Freq: Four times a day (QID) | ORAL | Status: DC | PRN

## 2013-02-03 MED ORDER — MENTHOL 3 MG MT LOZG
1.0000 | LOZENGE | OROMUCOSAL | Status: DC | PRN
  Filled 2013-02-03 (×2): qty 9

## 2013-02-03 SURGICAL SUPPLY — 41 items
BAG SPEC THK2 15X12 ZIP CLS (MISCELLANEOUS) ×1
BAG ZIPLOCK 12X15 (MISCELLANEOUS) ×2 IMPLANT
BANDAGE GAUZE ELAST BULKY 4 IN (GAUZE/BANDAGES/DRESSINGS) ×2 IMPLANT
BIT DRILL CANN LG 4.3MM (BIT) IMPLANT
BNDG COHESIVE 6X5 TAN STRL LF (GAUZE/BANDAGES/DRESSINGS) ×2 IMPLANT
CLOTH BEACON ORANGE TIMEOUT ST (SAFETY) ×2 IMPLANT
DRAPE C-ARM 42X72 X-RAY (DRAPES) IMPLANT
DRAPE LG THREE QUARTER DISP (DRAPES) ×1 IMPLANT
DRAPE STERI IOBAN 125X83 (DRAPES) ×2 IMPLANT
DRILL BIT CANN LG 4.3MM (BIT) ×2
DRSG AQUACEL AG ADV 3.5X10 (GAUZE/BANDAGES/DRESSINGS) ×1 IMPLANT
DRSG MEPILEX BORDER 4X4 (GAUZE/BANDAGES/DRESSINGS) ×1 IMPLANT
DRSG PAD ABDOMINAL 8X10 ST (GAUZE/BANDAGES/DRESSINGS) ×2 IMPLANT
DURAPREP 26ML APPLICATOR (WOUND CARE) ×2 IMPLANT
ELECT REM PT RETURN 9FT ADLT (ELECTROSURGICAL) ×2
ELECTRODE REM PT RTRN 9FT ADLT (ELECTROSURGICAL) ×1 IMPLANT
GLOVE BIOGEL PI IND STRL 7.5 (GLOVE) ×1 IMPLANT
GLOVE BIOGEL PI IND STRL 8 (GLOVE) ×1 IMPLANT
GLOVE BIOGEL PI INDICATOR 7.5 (GLOVE) ×1
GLOVE BIOGEL PI INDICATOR 8 (GLOVE) ×1
GLOVE SURG SS PI 7.5 STRL IVOR (GLOVE) ×2 IMPLANT
GLOVE SURG SS PI 8.0 STRL IVOR (GLOVE) IMPLANT
GOWN PREVENTION PLUS LG XLONG (DISPOSABLE) ×2 IMPLANT
GOWN PREVENTION PLUS XLARGE (GOWN DISPOSABLE) ×2 IMPLANT
GOWN STRL REIN XL XLG (GOWN DISPOSABLE) ×2 IMPLANT
GUIDEPIN 3.2X17.5 THRD DISP (PIN) ×1 IMPLANT
HFN 125 DEG 9MM X 180MM (Nail) ×1 IMPLANT
KIT BASIN OR (CUSTOM PROCEDURE TRAY) ×2 IMPLANT
MANIFOLD NEPTUNE II (INSTRUMENTS) ×2 IMPLANT
PACK GENERAL/GYN (CUSTOM PROCEDURE TRAY) ×2 IMPLANT
PAD CAST 4YDX4 CTTN HI CHSV (CAST SUPPLIES) ×1 IMPLANT
PADDING CAST COTTON 4X4 STRL (CAST SUPPLIES) ×2
SCREW BONE CORTICAL 5.0X3 (Screw) ×1 IMPLANT
SCREW LAG 10.5MMX105MM HFN (Screw) ×1 IMPLANT
SPONGE GAUZE 4X4 12PLY (GAUZE/BANDAGES/DRESSINGS) ×2 IMPLANT
STAPLER VISISTAT (STAPLE) ×2 IMPLANT
SUT VIC AB 0 CT1 27 (SUTURE) ×4
SUT VIC AB 0 CT1 27XBRD ANTBC (SUTURE) ×2 IMPLANT
SUT VIC AB 2-0 CT1 27 (SUTURE) ×2
SUT VIC AB 2-0 CT1 TAPERPNT 27 (SUTURE) ×1 IMPLANT
TRAY FOLEY CATH 14FRSI W/METER (CATHETERS) ×1 IMPLANT

## 2013-02-03 NOTE — Progress Notes (Signed)
Utilization review completed. Sussan Meter, RN, BSN. 

## 2013-02-03 NOTE — Preoperative (Signed)
Beta Blockers   Reason not to administer Beta Blockers:Not Applicable 

## 2013-02-03 NOTE — Anesthesia Preprocedure Evaluation (Addendum)
Anesthesia Evaluation  Patient identified by MRN, date of birth, ID band Patient awake    Reviewed: Allergy & Precautions, H&P , NPO status , Patient's Chart, lab work & pertinent test results, reviewed documented beta blocker date and time   Airway Mallampati: II TM Distance: >3 FB Neck ROM: Full    Dental  (+) Dental Advisory Given, Edentulous Upper and Edentulous Lower   Pulmonary neg pulmonary ROS,  breath sounds clear to auscultation  Pulmonary exam normal       Cardiovascular hypertension, Pt. on medications and Pt. on home beta blockers + CAD and + CABG + dysrhythmias Atrial Fibrillation Rhythm:Regular Rate:Normal  2011 Echo: Technically difficult study with poor acoustic windows. Mild LV   hypertrophy with normal LV systolic function, EF 55-60%. No  regional wall motion abnormalities noted. Mild diastolic dysfunction, mild left atrial enlargement, no significant valvular   abnormalities.    Neuro/Psych negative neurological ROS  negative psych ROS   GI/Hepatic negative GI ROS, GERD-  Medicated,(+) Cirrhosis -       ,   Endo/Other  diabetes, Type 2  Renal/GU negative Renal ROS     Musculoskeletal negative musculoskeletal ROS (+)   Abdominal   Peds  Hematology negative hematology ROS (+)   Anesthesia Other Findings   Reproductive/Obstetrics                        Anesthesia Physical Anesthesia Plan  ASA: III  Anesthesia Plan: General   Post-op Pain Management:    Induction: Intravenous  Airway Management Planned: Oral ETT  Additional Equipment:   Intra-op Plan:   Post-operative Plan: Extubation in OR  Informed Consent:   Dental advisory given  Plan Discussed with: CRNA  Anesthesia Plan Comments:         Anesthesia Quick Evaluation

## 2013-02-03 NOTE — Progress Notes (Signed)
[facility] PROGRESS NOTE  KAYDE WAREHIME HYQ:[id number] DOB: [birth year] DOA: 02/02/2013 PCP: Josue Hector, MD  Assessment/Plan: 1. Hip fracture = patient ready to proceed with ORIF today.  2. Cirrhosis - well compensated  3. CAD s/p remote CABG 4. Hx of PAF -  5. Mild thrombocytopenia  6. DM - ssi  Code Status: full  Family Communication: patient  Disposition Plan: snf   Consultants:  [facility]  [facility]o - [place]   Procedures:  ORIF  HPI/Subjective: In pain if he moves.  Denies chest pain or dyspnea   Objective: Filed Vitals:   02/02/13 1915 02/02/13 1951 02/03/13 0520 02/03/13 0612  BP: 145/56 116/93  110/52  Pulse: 79 98  66  Temp:  98.2 F (36.8 C)  97.9 F (36.6 C)  TempSrc:  Oral  Oral  Resp: 22 18  16   Height:  5\' 6"  (1.676 m) 5\' 6"  (1.676 m)   Weight:   70.024 kg (154 lb 6 oz)   SpO2: 96% 100%  97%    Intake/Output Summary (Last 24 hours) at 02/03/13 0720 Last data filed at 02/03/13 9528  Gross per 24 hour  Intake      0 ml  Output    500 ml  Net   -500 ml   Filed Weights   02/03/13 0520  Weight: 70.024 kg (154 lb 6 oz)    Exam:   General:  Oriented x3  Cardiovascular: post cabg scar, regular, no murmurs  Respiratory: ctab   Abdomen: soft, nt  Data Reviewed: Basic Metabolic Panel:  Recent Labs Lab 02/02/13 1618 02/02/13 2132 02/03/13 0530  NA 143  --  145  K 3.7  --  3.4*  CL 106  --  113*  CO2 25  --  26  GLUCOSE 238*  --  160*  BUN 14  --  15  CREATININE 0.57 0.63 0.51  CALCIUM 9.5  --  8.6  MG  --  1.9  --   PHOS  --  1.6*  --    Liver Function Tests:  Recent Labs Lab 02/02/13 2132 02/03/13 0530  AST 20 14  ALT 10 9  ALKPHOS 96 74  BILITOT 1.5* 1.4*  PROT 6.2 5.5*  ALBUMIN 2.7* 2.6*   No results found for this basename: LIPASE, AMYLASE,  in the last 168 hours No results found for this basename: AMMONIA,  in the last 168 hours CBC:  Recent Labs Lab 02/02/13 1618 02/02/13 2132  02/03/13 0530  WBC 8.6 7.4 7.1  NEUTROABS 6.6  --   --   HGB 13.1 12.3* 11.2*  HCT 37.6* 34.8* 32.3*  MCV 94.5 94.6 93.9  PLT 134* 126* 122*   Cardiac Enzymes: No results found for this basename: CKTOTAL, CKMB, CKMBINDEX, TROPONINI,  in the last 168 hours BNP (last 3 results) No results found for this basename: PROBNP,  in the last 8760 hours CBG:  Recent Labs Lab 02/02/13 2059 02/03/13 0653  GLUCAP 285* 138*    No results found for this or any previous visit (from the past 240 hour(s)).   Studies: Dg Ribs Unilateral W/chest Right  02/02/2013  *RADIOLOGY REPORT*  Clinical Data: Fall with chest pain.  RIGHT RIBS AND CHEST - 3+ VIEW  Comparison: 04/26/2012 and prior chest radiographs  Findings: Cardiomegaly and CABG changes noted. Bibasilar atelectasis identified with scattered areas of unchanged pulmonary scarring. There are nondisplaced fractures of the right 4th - 7th ribs and are age indeterminate. There is no evidence  of pleural effusion or pneumothorax. Remote appearing left 4th-8th rib fractures are present.  IMPRESSION: Nondisplaced right fourth - seventh rib fractures, age indeterminate.  Remote appearing fractures of the left 4th - 8th ribs but correlate clinically with pain.  Bibasilar atelectasis.   Original Report Authenticated By: Harmon Pier, M.D.    Dg Hip Complete Right  02/02/2013  *RADIOLOGY REPORT*  Clinical Data: Larey Seat, pain  RIGHT HIP - COMPLETE 2+ VIEW  Comparison: None.  Findings: Intertrochanteric fracture of the right hip.  Slight comminution.  Slight widening of the proximal aspect of the fracture.  No pelvic fractures.  Small fecal impaction.  IMPRESSION: Acute intertrochanteric fracture of the right hip.   Original Report Authenticated By: Davonna Belling, M.D.    Dg Knee 1-2 Views Right  02/02/2013  *RADIOLOGY REPORT*  Clinical Data: Fall with right knee pain.  RIGHT KNEE - 1-2 VIEW  Comparison: None  Findings: No acute fracture, subluxation or dislocation  identified. No definite effusion is identified. No focal bony lesions are present. Mild tricompartmental joint space narrowing identified.  IMPRESSION: No evidence of acute bony abnormality.   Original Report Authenticated By: Harmon Pier, M.D.    Ct Head Wo Contrast  02/02/2013  *RADIOLOGY REPORT*  Clinical Data: Larey Seat without loss of consciousness.  CT HEAD WITHOUT CONTRAST  Technique:  Contiguous axial images were obtained from the base of the skull through the vertex without contrast.  Comparison: 02/24/2005.  Findings: There is no evidence for acute infarction, intracranial hemorrhage, mass lesion, hydrocephalus, or extra-axial fluid.  Mild atrophy.  Mild chronic microvascular ischemic change.  Calvarium intact.  Advanced vascular calcification.  There is a hyperdense retention cyst in the central compartment of the sphenoid sinus measuring 21 x 24 mm cross-section.  Variable areas of increased attenuation could represent inspissated mucus, fungal infection, or even calcification. This retention cyst does not fill the sinus at this time and there are no features to suggest a mucocele.  Mild chronic sinus disease can be seen in both maxillary sinuses, and the posterior right ethmoids.  Compared with priors, atrophy has progressed.  IMPRESSION: Mild atrophy and chronic microvascular ischemic change.  There is no skull fracture or intracranial hemorrhage.  Chronic sinusitis as described.   Original Report Authenticated By: Davonna Belling, M.D.     Scheduled Meds: . sodium chloride   Intravenous STAT  . aspirin EC  81 mg Oral Daily  . clindamycin (CLEOCIN) IV  900 mg Intravenous Q8H  . escitalopram  10 mg Oral Daily  . folic acid  1 mg Oral Daily  . furosemide  40 mg Oral Daily  . heparin  5,000 Units Subcutaneous Q8H  . insulin aspart  0-15 Units Subcutaneous TID WC  . insulin aspart  0-5 Units Subcutaneous QHS  . insulin aspart  3 Units Subcutaneous TID WC  . lactulose  10 g Oral BID  . metoprolol  tartrate  25 mg Oral BID  . pantoprazole  40 mg Oral Daily  . potassium chloride SA  20 mEq Oral Daily  . sodium chloride  3 mL Intravenous Q12H  . sodium chloride  3 mL Intravenous Q12H  . thiamine  100 mg Oral Daily   Continuous Infusions: . sodium chloride      Principal Problem:   Intertrochanteric fracture of right hip Active Problems:   Type 2 diabetes mellitus   Essential hypertension, benign   Atrial fibrillation   CIRRHOSIS   Fracture, intertrochanteric, right femur   Preoperative evaluation to  rule out surgical contraindication     Kathi Dohn  [facility] Pager [phone]. If 8PM-8AM, please contact night-coverage at [email or web address], password [email or web address] 02/03/2013, 7:20 AM  LOS: 1 day

## 2013-02-03 NOTE — Anesthesia Postprocedure Evaluation (Signed)
Anesthesia Post Note  Patient: Anthony Page  Procedure(s) Performed: Procedure(s) (LRB): INTRAMEDULLARY (IM) NAIL FEMORAL (Right)  Anesthesia type: General  Patient location: PACU  Post pain: Pain level controlled  Post assessment: Post-op Vital signs reviewed  Last Vitals: BP 92/41  Pulse 66  Temp(Src) 36.6 C (Oral)  Resp 16  Ht 5\' 6"  (1.676 m)  Wt 154 lb 6 oz (70.024 kg)  BMI 24.93 kg/m2  SpO2 98%  Post vital signs: Reviewed  Level of consciousness: sedated  Complications: No apparent anesthesia complications

## 2013-02-03 NOTE — Transfer of Care (Signed)
Immediate Anesthesia Transfer of Care Note  Patient: Anthony Page  Procedure(s) Performed: Procedure(s) with comments: INTRAMEDULLARY (IM) NAIL FEMORAL (Right) - RIGHT HIP INTERMEDULLARY  NAIL  Patient Location: PACU  Anesthesia Type:General  Level of Consciousness: sedated, patient cooperative and responds to stimulation  Airway & Oxygen Therapy: Patient Spontanous Breathing and Patient connected to face mask oxygen  Post-op Assessment: Report given to PACU RN and Post -op Vital signs reviewed and stable  Post vital signs: Reviewed and stable  Complications: No apparent anesthesia complications

## 2013-02-03 NOTE — Progress Notes (Signed)
Consents have been signed and PCR done. [facility] to pick up pt at 1300 to transfer to [facility] for Dr Shelle Iron to perform surgery. Notified pts family of tranfer and Dr Shelle Iron will contact family as well.

## 2013-02-03 NOTE — Brief Op Note (Signed)
02/02/2013 - 02/03/2013  4:03 PM  PATIENT:  Deborra Medina  72 y.o. male  PRE-OPERATIVE DIAGNOSIS:  right intertrochanteric hip fracture  POST-OPERATIVE DIAGNOSIS:  right intertrochanteric hip fracture  PROCEDURE:  Procedure(s) with comments: INTRAMEDULLARY (IM) NAIL FEMORAL (Right) - RIGHT HIP INTERMEDULLARY  NAIL  SURGEON:  Surgeon(s) and Role:    * Javier Docker, MD - Primary  PHYSICIAN ASSISTANT:   ASSISTANTS: none   ANESTHESIA:   general  EBL:  Total I/O In: 1000 [I.V.:1000] Out: 450 [Urine:350; Blood:100]  BLOOD ADMINISTERED:none  DRAINS: none   LOCAL MEDICATIONS USED:  MARCAINE     SPECIMEN:  No Specimen  DISPOSITION OF SPECIMEN:  N/A  COUNTS:  YES  TOURNIQUET:  * No tourniquets in log *  DICTATION: .Other Dictation: Dictation Number [id number]  PLAN OF CARE: Admit to inpatient   PATIENT DISPOSITION:  PACU - hemodynamically stable.   Delay start of Pharmacological VTE agent (>24hrs) due to surgical blood loss or risk of bleeding: no

## 2013-02-03 NOTE — Progress Notes (Signed)
Patient Name: Anthony Page Date of Encounter: 02/03/2013   Principal Problem:   Intertrochanteric fracture of right hip Active Problems:   Fracture, intertrochanteric, right femur   Type 2 diabetes mellitus   Essential hypertension, benign   CORONARY ATHEROSCLEROSIS NATIVE CORONARY ARTERY   PAF (paroxysmal atrial fibrillation)   CIRRHOSIS   SUBJECTIVE  No chest pain or sob.  For IM nailing of R hip fx today.  CURRENT MEDS . sodium chloride   Intravenous STAT  . aspirin EC  81 mg Oral Daily  . clindamycin (CLEOCIN) IV  900 mg Intravenous Q8H  . escitalopram  10 mg Oral Daily  . folic acid  1 mg Oral Daily  . furosemide  40 mg Oral Daily  . heparin  5,000 Units Subcutaneous Q8H  . insulin aspart  0-15 Units Subcutaneous TID WC  . insulin aspart  0-5 Units Subcutaneous QHS  . insulin aspart  3 Units Subcutaneous TID WC  . lactulose  10 g Oral BID  . metoprolol tartrate  25 mg Oral BID  . pantoprazole  40 mg Oral Daily  . potassium chloride SA  20 mEq Oral Daily  . sodium chloride  3 mL Intravenous Q12H  . sodium chloride  3 mL Intravenous Q12H  . thiamine  100 mg Oral Daily    OBJECTIVE  Filed Vitals:   02/02/13 1915 02/02/13 1951 02/03/13 0520 02/03/13 0612  BP: 145/56 116/93  110/52  Pulse: 79 98  66  Temp:  98.2 F (36.8 C)  97.9 F (36.6 C)  TempSrc:  Oral  Oral  Resp: 22 18  16   Height:  5\' 6"  (1.676 m) 5\' 6"  (1.676 m)   Weight:   154 lb 6 oz (70.024 kg)   SpO2: 96% 100%  97%    Intake/Output Summary (Last 24 hours) at 02/03/13 0952 Last data filed at 02/03/13 0659  Gross per 24 hour  Intake   1500 ml  Output    500 ml  Net   1000 ml   Filed Weights   02/03/13 0520  Weight: 154 lb 6 oz (70.024 kg)    PHYSICAL EXAM  General: Pleasant, NAD. Neuro: Alert and oriented X 3. Moves all extremities spontaneously. Psych: Normal affect. HEENT:  Normal  Neck: Supple without bruits or JVD. Lungs:  Resp regular and unlabored, CTA. Heart: RRR no s3,  s4, or murmurs. Abdomen: Soft, non-tender, non-distended, BS + x 4.  Extremities: No clubbing, cyanosis or edema. DP/PT/Radials 2+ and equal bilaterally.  Accessory Clinical Findings  CBC  Recent Labs  02/02/13 1618 02/02/13 2132 02/03/13 0530  WBC 8.6 7.4 7.1  NEUTROABS 6.6  --   --   HGB 13.1 12.3* 11.2*  HCT 37.6* 34.8* 32.3*  MCV 94.5 94.6 93.9  PLT 134* 126* 122*   Basic Metabolic Panel  Recent Labs  02/02/13 1618 02/02/13 2132 02/03/13 0530  NA 143  --  145  K 3.7  --  3.4*  CL 106  --  113*  CO2 25  --  26  GLUCOSE 238*  --  160*  BUN 14  --  15  CREATININE 0.57 0.63 0.51  CALCIUM 9.5  --  8.6  MG  --  1.9  --   PHOS  --  1.6*  --    Liver Function Tests  Recent Labs  02/02/13 2132 02/03/13 0530  AST 20 14  ALT 10 9  ALKPHOS 96 74  BILITOT 1.5* 1.4*  PROT  6.2 5.5*  ALBUMIN 2.7* 2.6*   Hemoglobin A1C  Recent Labs  02/02/13 2132  HGBA1C 7.0*   TELE  rsr  ASSESSMENT AND PLAN  1.  R Intertrochanteric Hip Fx:  For IM nailing today.  From cardiac standpoint, cont bb throughout perioperative period w/ resumption of asa when feasible per surgery.  We will follow along.  2.  CAD:  See above.  He is not on a statin @ home.  3.  H/O PAF:  In sinus.  Cont bb.  Cont tele post-op.  4.  DM:  Cont ssi.  Signed, Nicolasa Ducking NP Patient examined and agree. Will add statin.  Valera Castle, MD 02/03/2013 10:43 AM

## 2013-02-03 NOTE — Interval H&P Note (Signed)
History and Physical Interval Note:  02/03/2013 1:29 PM  Anthony Page  has presented today for surgery, with the diagnosis of right intertrochanteric hip fracture  The various methods of treatment have been discussed with the patient and family. After consideration of risks, benefits and other options for treatment, the patient has consented to  Procedure(s) with comments: INTRAMEDULLARY (IM) NAIL FEMORAL (Right) - RIGHT HIP INTERMEDULLARY  NAIL as a surgical intervention .  The patient's history has been reviewed, patient examined, no change in status, stable for surgery.  I have reviewed the patient's chart and labs.  Questions were answered to the patient's satisfaction.     [name] C

## 2013-02-03 NOTE — Progress Notes (Signed)
Subjective: Right hip pain   Objective: Vital signs in last 24 hours: Temp:  [97.9 F (36.6 C)-98.8 F (37.1 C)] 97.9 F (36.6 C) (02/28 0612) Pulse Rate:  [66-98] 66 (02/28 0612) Resp:  [16-22] 16 (02/28 0612) BP: (110-168)/(52-93) 110/52 mmHg (02/28 0612) SpO2:  [95 %-100 %] 97 % (02/28 0612) Weight:  [70.024 kg (154 lb 6 oz)] 70.024 kg (154 lb 6 oz) (02/28 0520)  Intake/Output from previous day: 02/27 0701 - 02/28 0700 In: -  Out: 500 [Urine:500] Intake/Output this shift: Total I/O In: -  Out: 500 [Urine:500]   Recent Labs  02/02/13 1618 02/02/13 2132 02/03/13 0530  HGB 13.1 12.3* 11.2*    Recent Labs  02/02/13 2132 02/03/13 0530  WBC 7.4 7.1  RBC 3.68* 3.44*  HCT 34.8* 32.3*  PLT 126* 122*    Recent Labs  02/02/13 1618 02/02/13 2132 02/03/13 0530  NA 143  --  145  K 3.7  --  3.4*  CL 106  --  113*  CO2 25  --  26  BUN 14  --  15  CREATININE 0.57 0.63 0.51  GLUCOSE 238*  --  160*  CALCIUM 9.5  --  8.6    Recent Labs  02/02/13 1618 02/03/13 0530  INR 1.31 1.52*    pending  Assessment/Plan: Right hip fracture. Plan IM nail at [facility] this aft. No time at [facility] or last night Risks discussed.   Shayann Garbutt C [phone] 02/03/2013, 6:56 AM

## 2013-02-04 DIAGNOSIS — K7682 Hepatic encephalopathy: Secondary | ICD-10-CM

## 2013-02-04 LAB — BASIC METABOLIC PANEL
Calcium: 7.8 mg/dL — ABNORMAL LOW (ref 8.4–10.5)
GFR calc non Af Amer: >90 mL/min (ref >90)
Glucose, Bld: 209 mg/dL — ABNORMAL HIGH (ref 70–99)
Sodium: 143 mEq/L (ref 135–145)

## 2013-02-04 LAB — BLOOD GAS, ARTERIAL
Bicarbonate: 26.1 mEq/L — ABNORMAL HIGH (ref 20.0–24.0)
TCO2: 24.1 mmol/L (ref 0–100)
pCO2 arterial: 44 mmHg (ref 35.0–45.0)
pH, Arterial: 7.39 (ref 7.350–7.450)

## 2013-02-04 LAB — CBC
Hemoglobin: 10.2 g/dL — ABNORMAL LOW (ref 13.0–17.0)
MCH: 32 pg (ref 26.0–34.0)
MCHC: 33.1 g/dL (ref 30.0–36.0)
Platelets: 114 10*3/uL — ABNORMAL LOW (ref 150–400)
RDW: 14.5 % (ref 11.5–15.5)

## 2013-02-04 LAB — GLUCOSE, CAPILLARY
Glucose-Capillary: 153 mg/dL — ABNORMAL HIGH (ref 70–99)
Glucose-Capillary: 181 mg/dL — ABNORMAL HIGH (ref 70–99)

## 2013-02-04 MED ORDER — POTASSIUM CL IN DEXTROSE 5% 20 MEQ/L IV SOLN
20.0000 meq | INTRAVENOUS | Status: DC
  Filled 2013-02-04 (×2): qty 1000

## 2013-02-04 MED ORDER — NALOXONE HCL 0.4 MG/ML IJ SOLN: 0.4000 mg | INTRAMUSCULAR | Status: DC | PRN

## 2013-02-04 MED ORDER — LACTULOSE 10 GM/15ML PO SOLN
30.0000 g | Freq: Three times a day (TID) | ORAL | Status: DC
  Administered 2013-02-04 – 2013-02-05 (×2): 30 g via ORAL
  Filled 2013-02-04 (×5): qty 45

## 2013-02-04 MED ORDER — LACTULOSE 10 GM/15ML PO SOLN
30.0000 g | Freq: Once | ORAL | Status: AC
  Administered 2013-02-04: 30 g via ORAL
  Filled 2013-02-04: qty 45

## 2013-02-04 NOTE — Progress Notes (Addendum)
SUBJECTIVE:  No complaints of chest pain  OBJECTIVE:   Vitals:   Filed Vitals:   02/03/13 1721 02/03/13 2134 02/04/13 0219 02/04/13 0632  BP: 92/41 107/43 118/46 106/41  Pulse: 66 68 69 68  Temp: 97.9 F (36.6 C) 98.6 F (37 C) 98.7 F (37.1 C) 99 F (37.2 C)  TempSrc:  Oral Oral Oral  Resp: 16 16 18 20   Height:      Weight:      SpO2: 98% 100% 98% 98%   I&O's:   Intake/Output Summary (Last 24 hours) at 02/04/13 0800 Last data filed at 02/04/13 1914  Gross per 24 hour  Intake   2510 ml  Output   1000 ml  Net   1510 ml   TELEMETRY: Reviewed telemetry pt in NSR:     PHYSICAL EXAM General: Well developed, well nourished, in no acute distress Head: Eyes PERRLA, No xanthomas.   Normal cephalic and atramatic  Lungs:   Clear bilaterally to auscultation and percussion. Heart:   HRRR S1 S2 Pulses are 2+ & equal. Abdomen: Bowel sounds are positive, abdomen soft and non-tender without masses  Extremities:   No clubbing, cyanosis or edema.  DP +1 Neuro: Alert and oriented X 3. Psych:  Good affect, responds appropriately   LABS: Basic Metabolic Panel:  Recent Labs  78/29/56 1618 02/02/13 2132 02/03/13 0530 02/03/13 1810 02/04/13 0540  NA 143  --  145  --  143  K 3.7  --  3.4*  --  3.9  CL 106  --  113*  --  112  CO2 25  --  26  --  28  GLUCOSE 238*  --  160*  --  209*  BUN 14  --  15  --  13  CREATININE 0.57 0.63 0.51 0.62 0.55  CALCIUM 9.5  --  8.6  --  7.8*  MG  --  1.9  --   --   --   PHOS  --  1.6*  --   --   --    Liver Function Tests:  Recent Labs  02/02/13 2132 02/03/13 0530  AST 20 14  ALT 10 9  ALKPHOS 96 74  BILITOT 1.5* 1.4*  PROT 6.2 5.5*  ALBUMIN 2.7* 2.6*   No results found for this basename: LIPASE, AMYLASE,  in the last 72 hours CBC:  Recent Labs  02/02/13 1618  02/03/13 1810 02/04/13 0540  WBC 8.6  < > 8.0 6.2  NEUTROABS 6.6  --   --   --   HGB 13.1  < > 11.1* 10.2*  HCT 37.6*  < > 33.9* 30.8*  MCV 94.5  < > 98.0 96.6  PLT  134*  < > 123* 114*  < > = values in this interval not displayed. Cardiac Enzymes: No results found for this basename: CKTOTAL, CKMB, CKMBINDEX, TROPONINI,  in the last 72 hours BNP: No components found with this basename: POCBNP,  D-Dimer: No results found for this basename: DDIMER,  in the last 72 hours Hemoglobin A1C:  Recent Labs  02/02/13 2132  HGBA1C 7.0*   Fasting Lipid Panel: No results found for this basename: CHOL, HDL, LDLCALC, TRIG, CHOLHDL, LDLDIRECT,  in the last 72 hours Thyroid Function Tests: No results found for this basename: TSH, T4TOTAL, FREET3, T3FREE, THYROIDAB,  in the last 72 hours Anemia Panel: No results found for this basename: VITAMINB12, FOLATE, FERRITIN, TIBC, IRON, RETICCTPCT,  in the last 72 hours Coag Panel:   Lab  Results  Component Value Date   INR 1.52* 02/03/2013   INR 1.31 02/02/2013   INR 1.0 12/22/2010    RADIOLOGY: Dg Ribs Unilateral W/chest Right  02/02/2013  *RADIOLOGY REPORT*  Clinical Data: Fall with chest pain.  RIGHT RIBS AND CHEST - 3+ VIEW  Comparison: 04/26/2012 and prior chest radiographs  Findings: Cardiomegaly and CABG changes noted. Bibasilar atelectasis identified with scattered areas of unchanged pulmonary scarring. There are nondisplaced fractures of the right 4th - 7th ribs and are age indeterminate. There is no evidence of pleural effusion or pneumothorax. Remote appearing left 4th-8th rib fractures are present.  IMPRESSION: Nondisplaced right fourth - seventh rib fractures, age indeterminate.  Remote appearing fractures of the left 4th - 8th ribs but correlate clinically with pain.  Bibasilar atelectasis.   Original Report Authenticated By: Harmon Pier, M.D.    Dg Hip Complete Right  02/02/2013  *RADIOLOGY REPORT*  Clinical Data: Larey Seat, pain  RIGHT HIP - COMPLETE 2+ VIEW  Comparison: None.  Findings: Intertrochanteric fracture of the right hip.  Slight comminution.  Slight widening of the proximal aspect of the fracture.  No  pelvic fractures.  Small fecal impaction.  IMPRESSION: Acute intertrochanteric fracture of the right hip.   Original Report Authenticated By: Davonna Belling, M.D.    Dg Hip Operative Right  02/03/2013  *RADIOLOGY REPORT*  Clinical Data: Fracture fixation.  DG OPERATIVE RIGHT HIP  Comparison: Plain films 02/02/2013.  Findings: We are provided with six intraoperative views of the right hip.  Images demonstrate placement of a dynamic hip screw with a short IM nail for fixation of an intertrochanteric fracture. No evidence of complication.  IMPRESSION: ORIF right intertrochanteric fracture.   Original Report Authenticated By: Holley Dexter, M.D.    Dg Knee 1-2 Views Right  02/02/2013  *RADIOLOGY REPORT*  Clinical Data: Fall with right knee pain.  RIGHT KNEE - 1-2 VIEW  Comparison: None  Findings: No acute fracture, subluxation or dislocation identified. No definite effusion is identified. No focal bony lesions are present. Mild tricompartmental joint space narrowing identified.  IMPRESSION: No evidence of acute bony abnormality.   Original Report Authenticated By: Harmon Pier, M.D.    Ct Head Wo Contrast  02/02/2013  *RADIOLOGY REPORT*  Clinical Data: Larey Seat without loss of consciousness.  CT HEAD WITHOUT CONTRAST  Technique:  Contiguous axial images were obtained from the base of the skull through the vertex without contrast.  Comparison: 02/24/2005.  Findings: There is no evidence for acute infarction, intracranial hemorrhage, mass lesion, hydrocephalus, or extra-axial fluid.  Mild atrophy.  Mild chronic microvascular ischemic change.  Calvarium intact.  Advanced vascular calcification.  There is a hyperdense retention cyst in the central compartment of the sphenoid sinus measuring 21 x 24 mm cross-section.  Variable areas of increased attenuation could represent inspissated mucus, fungal infection, or even calcification. This retention cyst does not fill the sinus at this time and there are no features to  suggest a mucocele.  Mild chronic sinus disease can be seen in both maxillary sinuses, and the posterior right ethmoids.  Compared with priors, atrophy has progressed.  IMPRESSION: Mild atrophy and chronic microvascular ischemic change.  There is no skull fracture or intracranial hemorrhage.  Chronic sinusitis as described.   Original Report Authenticated By: Davonna Belling, M.D.    Dg Pelvis Portable  02/03/2013  *RADIOLOGY REPORT*  Clinical Data: 72 year old male status post right hip surgery.  PORTABLE PELVIS  Comparison: 1520 hours the same day and earlier.  Findings: Portable  AP view of the pelvis 1820 hours.  New proximal right femur intramedullary rod with proximal interlocking dynamic hip screw and distal interlocking cortical screw.  Near anatomic alignment at the right intertrochanteric fracture.  Overlying postoperative soft tissue change is including skin staples.  Right femoral head normally located.  Pelvis appears intact.  IMPRESSION: ORIF right proximal femur with no adverse features.   Original Report Authenticated By: Erskine Speed, M.D.     ASSESSMENT AND PLAN  1. R Intertrochanteric Hip Fx s/p nailing 2. CAD: continue beta blocker.  Readd ASA 81mg  daily when ok with ortho 3. H/O PAF: In sinus. Cont bb.   4. DM: Cont ssi.    Quintella Reichert, MD  02/04/2013  8:00 AM

## 2013-02-04 NOTE — Progress Notes (Signed)
   Subjective: 1 Day Post-Op Procedure(s) (LRB): INTRAMEDULLARY (IM) NAIL FEMORAL (Right)   Patient reports pain as mild. Pt resting comfortably this morning with mild pain to right hip  Objective:   VITALS:   Filed Vitals:   02/04/13 0632  BP: 106/41  Pulse: 68  Temp: 99 F (37.2 C)  Resp: 20   Very minimal drainage to dressing Neurologically intact distally No erythema  LABS  Recent Labs  02/03/13 0530 02/03/13 1810 02/04/13 0540  HGB 11.2* 11.1* 10.2*  HCT 32.3* 33.9* 30.8*  WBC 7.1 8.0 6.2  PLT 122* 123* 114*     Recent Labs  02/02/13 1618  02/03/13 0530 02/03/13 1810 02/04/13 0540  NA 143  --  145  --  143  K 3.7  --  3.4*  --  3.9  BUN 14  --  15  --  13  CREATININE 0.57  < > 0.51 0.62 0.55  GLUCOSE 238*  --  160*  --  209*  < > = values in this interval not displayed.   Assessment/Plan: 1 Day Post-Op Procedure(s) (LRB): INTRAMEDULLARY (IM) NAIL FEMORAL (Right)   PT/OT D/c planning Pain control Pulmonary toilet   Alphonsa Overall, MPAS, PA-C  02/04/2013, 7:48 AM

## 2013-02-04 NOTE — Discharge Summary (Signed)
Physician Discharge Summary  Anthony Page NWG:[id number] DOB: October 26, 1941 DOA: 02/02/2013  PCP: Josue Hector, MD  Admit date: 02/02/2013 Discharge date: 02/07/2013  Recommendations for Outpatient Follow-up:  1. To SNF for ongoing PT/OT.  Recommending patient use rolling walker with 5" wheels and wheelchair, 3-in-1 bedside commode and tub/shower bench.   2. Partial weight bearing right lower extremity 3. Follow up Dr. Shelle Iron for staple removal.   4.  May replace dressing PRN, keep incision clean and dry.  No submersion.    Discharge Diagnoses:  Principal Problem:   Intertrochanteric fracture of right hip Active Problems:   Type 2 diabetes mellitus   Essential hypertension, benign   CORONARY ATHEROSCLEROSIS NATIVE CORONARY ARTERY   CIRRHOSIS   Fracture, intertrochanteric, right femur   PAF (paroxysmal atrial fibrillation)   Encephalopathy, hepatic   Discharge Condition: stable, improved  Diet recommendation: diabetic, 2gm sodium  Wt Readings from Last 3 Encounters:  02/07/13 80.9 kg (178 lb 5.6 oz)  02/07/13 80.9 kg (178 lb 5.6 oz)  12/19/12 77.111 kg (170 lb)    History of present illness:   Anthony Page is a 72 y.o. male  With past medical history of cirrhosis and status post portal couple shunt, CAD status post CABG, thrombocytopenia, delirium post reparative, paroxysmal A. fib currently not on Coumadin secondary to his elevated PT and INR and liver disease, diastolic heart failure with an echo in 2011 revealed LVEF 55-60%, grade 1 diastolic dysfunction. Comes in for a followup earlier this morning landing on his right side. From cleaning of right knee and hip pain. Is also complaining of rib pain. In the emergency room x-ray was done that showedAcute intertrochanteric fracture of the right hip. The x-ray does not show any evidence of fracture and rib x-ray.  Nondisplaced right fourth - seventh rib fractures, age indeterminate.   Hospital Course:   Right  intertrochanteric hip fracture s/p intramedullary nailing 2/28.  Doing well postoperatively.  Tolerating oral pain medication and has worked with PT/OT who are recommending skilled nursing for ongoing rehabilitation.  He should continue daily aspirin and lovenox 40mg  subcutaneous daily until he follows up with orthopedics in 2 weeks.    CAD s/p CABG and grade 1 diastolic heart failure.  He was seen by cardiology for preoperative assessment.  His aspirin was held perioperatively, but restarted, and his beta blocker was continued.  He was started on atorvastatin and restarted on a slightly lower dose of ACEI.  Dose may need to be increased if blood pressure tolerates or requires, which can be done at his follow up appointment with his PCP.  His lasix was continued.    PAF previously in the post-operative setting, stable in sinus rhythm with normal rate for 48 hours postoperatively.  Not anticoagulated due to isolated episode of a-fib with known reversible cause previously.  He was continued on beta blocker.  He is not on warfarin.  Aspirin restarted.    Hepatic encephalopathy:  Somnolence and confusion developed postoperatively, likely due to hepatic encephalopathy and narcotic use.  ABG was negative for hypercapnea.  His IV pain medications were discontinued.  His ammonia level was 90 so his lactulose dose was increased so that he had 4-7 BMs per day for the subsequent two days.  His repeat ammonia level was 50 and his asterixis and orientation improved.    Cirrhosis, unclear etiology: No signs of SBP or GIB.  Continued lasix  Cough.  While sleeping, patient appears to have snoring without pauses.  Consider outpatient sleep study.  Has some upper airway congestion also. No fevers.  CXR negative for pneumonia.  No hypoxia or tachypnea.    T2DM: FS stable to mildly elevated post operatively.  He may resume his metformin as outpatient  Normocytic anemia, likely due to blood loss during surgery and chronic  disease.  Follow up with PCP for further evaluation of anemia if not already complete.    Thrombocytopenia, likely due to cirrhosis, higher than previous baseline. No evidence of bleeding.    Consultants:  Cardiology: Dr. Mayford Knife  Orthopedics: Dr. Shelle Iron Procedures:  Intramedullary nailing of right hip 2/28 Antibiotics:  None   Discharge Exam: Filed Vitals:   02/07/13 0800  BP:   Pulse:   Temp:   Resp: 20   Filed Vitals:   02/07/13 0149 02/07/13 0400 02/07/13 0457 02/07/13 0800  BP:   134/53   Pulse: 72  71   Temp:   98.6 F (37 C)   TempSrc:   Oral   Resp:  18 18 20   Height:      Weight:   80.9 kg (178 lb 5.6 oz)   SpO2: 94% 100% 100% 95%    Patient states thinking is clearer.  Denies CP, SOB, N/V.  Has been having frequent bowel movements after starting increased lactulose.  Denies dysuria or problems urinating.    General: Average weight CM, No acute distress, lying in bed. HEENT: NCAT, MMM  Cardiovascular: RRR, nl S1, S2 no mrg, 2+ pulses, warm extremities  Respiratory: CTAB, no increased WOB  Abdomen: NABS, soft, NT/ND  MSK: Normal tone and bulk, no LEE.  Dressing with 10cm dried blood streak superior to a second area 1/2 dollar sized, stable for last two days.  Less TTP along right lateral femur/greater trochanter.  Right leg DP palpable. Extremity warm and dry. Neuro: Sleepy but easily aroused.  Answers questions appropriately and follows commands.     Discharge Instructions      Discharge Orders   Future Orders Complete By Expires     Call MD for:  difficulty breathing, headache or visual disturbances  As directed     Call MD for:  extreme fatigue  As directed     Call MD for:  hives  As directed     Call MD for:  persistant dizziness or light-headedness  As directed     Call MD for:  persistant nausea and vomiting  As directed     Call MD for:  redness, tenderness, or signs of infection (pain, swelling, redness, odor or green/yellow discharge around  incision site)  As directed     Call MD for:  severe uncontrolled pain  As directed     Call MD for:  temperature >100.4  As directed     Diet Carb Modified  As directed     Comments:      2 gm sodium    Discharge instructions  As directed     Comments:      You were hospitalized with a right hip fracture and had intramedullary nailing on 2/28.  You developed some confusion related to your cirrhosis which is caused by accumulation of ammonia in the blood stream.  Your confusion improved with increased lactulose which causes diarrhea and brings down ammonia levels.  You will need to continue increased lactulose to help prevent confusion in the future.  You were started on a lower dose of lisinopril because your blood pressures have been normal on a lower dose.  You were started on atorvastatin for your heart disease.    Increase activity slowly  As directed     Partial weight bearing  As directed     Scheduling Instructions:      Right leg        Medication List    TAKE these medications       acetaminophen 325 MG tablet  Commonly known as:  TYLENOL  Take 2 tablets (650 mg total) by mouth every 6 (six) hours as needed for pain.     aspirin EC 81 MG tablet  Take 81 mg by mouth daily.     atorvastatin 10 MG tablet  Commonly known as:  LIPITOR  Take 1 tablet (10 mg total) by mouth daily at 6 PM.     benzonatate 100 MG capsule  Commonly known as:  TESSALON  Take 1 capsule (100 mg total) by mouth 3 (three) times daily as needed for cough.     enoxaparin 30 MG/0.3ML injection  Commonly known as:  LOVENOX  Inject 0.4 mLs (40 mg total) into the skin daily.     escitalopram 10 MG tablet  Commonly known as:  LEXAPRO  Take 10 mg by mouth Daily.     feeding supplement Liqd  Take 237 mLs by mouth 2 (two) times daily between meals.     folic acid 1 MG tablet  Commonly known as:  FOLVITE  Take 1 mg by mouth daily.     furosemide 40 MG tablet  Commonly known as:  LASIX  Take 40 mg  by mouth daily.     glipiZIDE 5 MG 24 hr tablet  Commonly known as:  GLUCOTROL XL  Take 5 mg by mouth daily.     HYDROcodone-acetaminophen 5-325 MG per tablet  Commonly known as:  NORCO  Take 1-2 tablets by mouth every 6 (six) hours as needed for pain.     lactulose 10 GM/15ML solution  Commonly known as:  CHRONULAC  Take 30 mLs (20 g total) by mouth 2 (two) times daily.     lisinopril 5 MG tablet  Commonly known as:  PRINIVIL,ZESTRIL  Take 1 tablet (5 mg total) by mouth daily.     metFORMIN 500 MG 24 hr tablet  Commonly known as:  GLUCOPHAGE-XR  Take 1,000 mg by mouth Twice daily.     metoprolol tartrate 25 MG tablet  Commonly known as:  LOPRESSOR  Take 25 mg by mouth 2 (two) times daily.     omeprazole 20 MG capsule  Commonly known as:  PRILOSEC  Take 20 mg by mouth daily.     potassium chloride SA 20 MEQ tablet  Commonly known as:  K-DUR,KLOR-CON  Take 20 mEq by mouth daily.       Follow-up Information   Follow up with BEANE,JEFFREY C, MD In 2 weeks.   Contact information:   [address] [place] [address] [phone]       Schedule an appointment as soon as possible for a visit with Josue Hector, MD. (As needed)    Contact information:   [address] [facility] [address] [phone]       The results of significant diagnostics from this hospitalization (including imaging, microbiology, ancillary and laboratory) are listed below for reference.    Significant Diagnostic Studies: Dg Ribs Unilateral W/chest Right  02/02/2013  *RADIOLOGY REPORT*  Clinical Data: Fall with chest pain.  RIGHT RIBS AND CHEST - 3+ VIEW  Comparison: 04/26/2012 and prior  chest radiographs  Findings: Cardiomegaly and CABG changes noted. Bibasilar atelectasis identified with scattered areas of unchanged pulmonary scarring. There are nondisplaced fractures of the right 4th - 7th ribs and are age indeterminate. There is no evidence of pleural effusion or  pneumothorax. Remote appearing left 4th-8th rib fractures are present.  IMPRESSION: Nondisplaced right fourth - seventh rib fractures, age indeterminate.  Remote appearing fractures of the left 4th - 8th ribs but correlate clinically with pain.  Bibasilar atelectasis.   Original Report Authenticated By: Harmon Pier, M.D.    Dg Hip Complete Right  02/02/2013  *RADIOLOGY REPORT*  Clinical Data: Larey Seat, pain  RIGHT HIP - COMPLETE 2+ VIEW  Comparison: None.  Findings: Intertrochanteric fracture of the right hip.  Slight comminution.  Slight widening of the proximal aspect of the fracture.  No pelvic fractures.  Small fecal impaction.  IMPRESSION: Acute intertrochanteric fracture of the right hip.   Original Report Authenticated By: Davonna Belling, M.D.    Dg Hip Operative Right  02/03/2013  *RADIOLOGY REPORT*  Clinical Data: Fracture fixation.  DG OPERATIVE RIGHT HIP  Comparison: Plain films 02/02/2013.  Findings: We are provided with six intraoperative views of the right hip.  Images demonstrate placement of a dynamic hip screw with a short IM nail for fixation of an intertrochanteric fracture. No evidence of complication.  IMPRESSION: ORIF right intertrochanteric fracture.   Original Report Authenticated By: Holley Dexter, M.D.    Dg Knee 1-2 Views Right  02/02/2013  *RADIOLOGY REPORT*  Clinical Data: Fall with right knee pain.  RIGHT KNEE - 1-2 VIEW  Comparison: None  Findings: No acute fracture, subluxation or dislocation identified. No definite effusion is identified. No focal bony lesions are present. Mild tricompartmental joint space narrowing identified.  IMPRESSION: No evidence of acute bony abnormality.   Original Report Authenticated By: Harmon Pier, M.D.    Ct Head Wo Contrast  02/02/2013  *RADIOLOGY REPORT*  Clinical Data: Larey Seat without loss of consciousness.  CT HEAD WITHOUT CONTRAST  Technique:  Contiguous axial images were obtained from the base of the skull through the vertex without contrast.   Comparison: 02/24/2005.  Findings: There is no evidence for acute infarction, intracranial hemorrhage, mass lesion, hydrocephalus, or extra-axial fluid.  Mild atrophy.  Mild chronic microvascular ischemic change.  Calvarium intact.  Advanced vascular calcification.  There is a hyperdense retention cyst in the central compartment of the sphenoid sinus measuring 21 x 24 mm cross-section.  Variable areas of increased attenuation could represent inspissated mucus, fungal infection, or even calcification. This retention cyst does not fill the sinus at this time and there are no features to suggest a mucocele.  Mild chronic sinus disease can be seen in both maxillary sinuses, and the posterior right ethmoids.  Compared with priors, atrophy has progressed.  IMPRESSION: Mild atrophy and chronic microvascular ischemic change.  There is no skull fracture or intracranial hemorrhage.  Chronic sinusitis as described.   Original Report Authenticated By: Davonna Belling, M.D.    Dg Pelvis Portable  02/03/2013  *RADIOLOGY REPORT*  Clinical Data: 72 year old male status post right hip surgery.  PORTABLE PELVIS  Comparison: 1520 hours the same day and earlier.  Findings: Portable AP view of the pelvis 1820 hours.  New proximal right femur intramedullary rod with proximal interlocking dynamic hip screw and distal interlocking cortical screw.  Near anatomic alignment at the right intertrochanteric fracture.  Overlying postoperative soft tissue change is including skin staples.  Right femoral head normally located.  Pelvis appears intact.  IMPRESSION: ORIF right proximal femur with no adverse features.   Original Report Authenticated By: Erskine Speed, M.D.     Microbiology: Recent Results (from the past 240 hour(s))  SURGICAL PCR SCREEN     Status: None   Collection Time    02/03/13  8:11 AM      Result Value Range Status   MRSA, PCR NEGATIVE  NEGATIVE Final   Staphylococcus aureus NEGATIVE  NEGATIVE Final   Comment:             The Xpert SA Assay (FDA     approved for NASAL specimens     in patients over 33 years of age),     is one component of     a comprehensive surveillance     program.  Test performance has     been validated by [employer] for patients greater     than or equal to 68 year old.     It is not intended     to diagnose infection nor to     guide or monitor treatment.     Labs: Basic Metabolic Panel:  Recent Labs Lab 02/02/13 1618 02/02/13 2132 02/03/13 0530 02/03/13 1810 02/04/13 0540 02/05/13 0442 02/07/13 0510  NA 143  --  145  --  143 141 142  K 3.7  --  3.4*  --  3.9 4.2 3.0*  CL 106  --  113*  --  112 110 109  CO2 25  --  26  --  28 25 26   GLUCOSE 238*  --  160*  --  209* 185* 233*  BUN 14  --  15  --  13 11 13   CREATININE 0.57 0.63 0.51 0.62 0.55 0.57 0.58  CALCIUM 9.5  --  8.6  --  7.8* 7.9* 7.9*  MG  --  1.9  --   --   --   --   --   PHOS  --  1.6*  --   --   --   --   --    Liver Function Tests:  Recent Labs Lab 02/02/13 2132 02/03/13 0530  AST 20 14  ALT 10 9  ALKPHOS 96 74  BILITOT 1.5* 1.4*  PROT 6.2 5.5*  ALBUMIN 2.7* 2.6*   No results found for this basename: LIPASE, AMYLASE,  in the last 168 hours  Recent Labs Lab 02/04/13 1009 02/07/13 0510  AMMONIA 90* 49   CBC:  Recent Labs Lab 02/02/13 1618  02/03/13 0530 02/03/13 1810 02/04/13 0540 02/05/13 0442 02/06/13 0425  WBC 8.6  < > 7.1 8.0 6.2 6.0 6.9  NEUTROABS 6.6  --   --   --   --   --   --   HGB 13.1  < > 11.2* 11.1* 10.2* 10.0* 10.1*  HCT 37.6*  < > 32.3* 33.9* 30.8* 29.5* 29.2*  MCV 94.5  < > 93.9 98.0 96.6 95.5 94.8  PLT 134*  < > 122* 123* 114* 127* 123*  < > = values in this interval not displayed. Cardiac Enzymes: No results found for this basename: CKTOTAL, CKMB, CKMBINDEX, TROPONINI,  in the last 168 hours BNP: BNP (last 3 results) No results found for this basename: PROBNP,  in the last 8760 hours CBG:  Recent Labs Lab 02/06/13 1231 02/06/13 1641  02/06/13 2211 02/07/13 0754 02/07/13 1147  GLUCAP 253* 214* 258* 196* 262*    Time coordinating discharge:  45  minutes  Signed:  Renae Fickle  [facility] 02/07/2013, 12:13 PM

## 2013-02-04 NOTE — Op Note (Signed)
NAMEVIOLA, PLACERES NO.:  [id number]  MEDICAL RECORD NO.:  [id number]  LOCATION:  1411                         FACILITY:  [facility]  PHYSICIAN:  Jene Every, M.D.    DATE OF BIRTH:  [birth year]  DATE OF PROCEDURE:  02/03/2013 DATE OF DISCHARGE:  02/03/2013                              OPERATIVE REPORT   PREOPERATIVE DIAGNOSIS:  Right intertrochanteric hip fracture.  POSTOPERATIVE DIAGNOSIS:  Right intertrochanteric hip fracture.  PROCEDURE PERFORMED:  Intramedullary nailing, right femur, utilizing a [employer] 9 mm short trochanteric nail, 125 angle, 105 degree lag screw.  ANESTHESIA:  General.  ASSISTANT:  None.  HISTORY:  A 72 year old, fractured intertroch, indicated for stabilization.  Risks and benefits were discussed including bleeding, infection, DVT, PE, nonunion, etc.  TECHNIQUE:  With the patient in supine position, after induction of adequate level of general anesthesia, 900 clindamycin was placed on the fracture table.  All bony prominences were well padded.  Foley to gravity.  Well leg in gentle flexion, external rotation, abduction. Right lower extremity in longitudinal traction, internal rotation.  The fracture was reduced, closed under C-arm.  We then prepped and draped the right lower extremity, hip, and the pelvic area in the usual sterile fashion.  An incision was made just proximal to the trochanter. Subcutaneous tissue was dissected.  Electrocautery was utilized to achieve hemostasis.  Tensor fascia divided in line with the skin incision.  Tip of the trochanter was identified.  Guide pin advanced into the canal in the AP and lateral planes satisfactorily.  It was overreamed, was irrigated, and then placed a 9 short Torx screw, lag screw, lag rod into place.  Next, using the external alignment guide and a small stab incision over the lateral aspect of the femur, guide pin upped into the femoral head and neck in the AP and lateral  planes satisfactorily.  This was reamed to 105, placed a 105 lag screw. Excellent purchase was placed on insertion of the lag screw, in the AP and lateral planes.  We locked it proximally.  We then distally placed a distal alignment, distal locking screw and the static lock.  Using external alignment guide and a stab incision of the skin, and blunt dissection to the femur, drilled, measured to a 34, placed a bicortical 34 screw.  Excellent purchase in the AP and lateral planes.  The implant was placed satisfactory in the AP and lateral planes under C-arm.  Prior to placing the screw, we had compressed at the fracture site, relieving traction on the extremity.  Wound was copiously irrigated.  No active bleeding.  We had to use electrocautery, closed the fascia with 0 Vicryl interrupted figure-of-eight sutures, subcu with 2-0 Vicryl simple sutures.  Skin was reapproximated with staples.  Wound was dressed sterilely and released from the fracture table in supine on the hospital bed.  Good leg lengths, identical rotation, and symmetric.  Good pulses. Warm good capillary refill, extubated without difficulty, and transported to the recovery room in satisfactory condition.  The patient tolerated the procedure well with no complications.  No assistant.  Blood loss was 100 mL.     Jene Every, M.D.  JB/MEDQ  D:  02/03/2013  T:  02/04/2013  Job:  [id number]

## 2013-02-04 NOTE — Evaluation (Signed)
Physical Therapy Evaluation Patient Details Name: Anthony Page MRN: [id number] DOB: [birth year] Today's Date: 02/04/2013 Time: 1510-1600 PT Time Calculation (min): 50 min  PT Assessment / Plan / Recommendation Clinical Impression  Pt with R ORIF of hip with limited ability with mobility due to weakness and challenges with WB status limitations.     PT Assessment  Patient needs continued PT services    Follow Up Recommendations  SNF    Does the patient have the potential to tolerate intense rehabilitation      Barriers to Discharge        Equipment Recommendations  Rolling walker with 5" wheels;Wheelchair (measurements PT)    Recommendations for Other Services     Frequency Min 5X/week    Precautions / Restrictions Precautions Precautions: Fall Restrictions Weight Bearing Restrictions: Yes RLE Weight Bearing: Touchdown weight bearing   Pertinent Vitals/Pain Tolerable with mobility, unable to rate at this time.       Mobility  Bed Mobility Bed Mobility: Supine to Sit;Sit to Supine Supine to Sit: 3: Mod assist;With rails;HOB elevated Sit to Supine: 3: Mod assist;With rail;HOB elevated Details for Bed Mobility Assistance: cue and assistance with upper nd lower body and using chuck pad to scoot hips.  Transfers Transfers: Not assessed Details for Transfer Assistance: not assessed due to will be +2 assist to stand due to TDWB status and challegning with sitting.  Worked on sitting EOB at about 10 minutes with cues for upright posture due to listing L at times and very groggy.  Ambulation/Gait Ambulation/Gait Assistance: Not tested (comment)    Exercises General Exercises - Lower Extremity Ankle Circles/Pumps: AROM;Right;Left;Both Heel Slides: AAROM;Right;5 reps Hip ABduction/ADduction: AAROM;Right;5 reps   PT Diagnosis: Difficulty walking;Generalized weakness;Acute pain  PT Problem List: Decreased strength;Decreased range of motion;Decreased activity  tolerance;Decreased balance;Decreased mobility;Decreased knowledge of use of DME;Decreased safety awareness;Decreased knowledge of precautions PT Treatment Interventions: Gait training;Functional mobility training;Therapeutic activities;Therapeutic exercise;Patient/family education   PT Goals Acute Rehab PT Goals PT Goal Formulation: With patient Time For Goal Achievement: 02/18/13 Potential to Achieve Goals: Good Pt will go Supine/Side to Sit: with supervision PT Goal: Supine/Side to Sit - Progress: Goal set today Pt will go Sit to Stand: with supervision PT Goal: Sit to Stand - Progress: Goal set today Pt will go Stand to Sit: with supervision PT Goal: Stand to Sit - Progress: Goal set today Pt will Ambulate: 1 - 15 feet;with min assist;with rolling walker PT Goal: Ambulate - Progress: Goal set today  Visit Information  Last PT Received On: 02/04/13 Assistance Needed: +2    Subjective Data  Subjective: I don't htink I can do anything, my leg hurts.  Patient Stated Goal: To eventually head back home.    Prior Functioning  Home Living Lives With: Alone Available Help at Discharge: Family;Friend(s) Type of Home: House Home Access: Stairs to enter [employer] of Steps: 2 Entrance Stairs-Rails: Right (unsure) Home Layout: One level Home Adaptive Equipment: Straight cane;Walker - rolling Additional Comments: difficult to understand pt with congestion at times and possible not completed clear with cognition.  Prior Function Level of Independence: Independent with assistive device(s) Able to Take Stairs?: Yes Driving: Yes Comments: unsure of clarity of this per patient report    Cognition  Cognition Overall Cognitive Status: Appears within functional limits for tasks assessed/performed Arousal/Alertness: Lethargic Orientation Level: Appears intact for tasks assessed Behavior During Session: Lethargic (at times)    Extremity/Trunk Assessment Right Lower Extremity  Assessment RLE ROM/Strength/Tone: Deficits;Due to pain RLE  ROM/Strength/Tone Deficits: limited with pain on RLE, about 2/5 for hip and knee flexion  RLE Sensation: WFL - Light Touch Left Lower Extremity Assessment LLE ROM/Strength/Tone: Within functional levels LLE Sensation: WFL - Light Touch   Balance    End of Session PT - End of Session Activity Tolerance: Patient limited by pain Patient left: in bed;with nursing in room Nurse Communication: Mobility status  GP     Marella Bile 02/04/2013, 4:41 PM .sahr

## 2013-02-04 NOTE — Progress Notes (Signed)
[facility] PROGRESS NOTE  Anthony Page:[id number] DOB: 09-12-1941 DOA: 02/02/2013 PCP: Josue Hector, MD  Assessment/Plan  Right intertrochanteric hip fracture s/p intramedullary nailing 2/28.   -  POD 1 -  Pain control:  Patient seems overly sedated this morning.  Reduce dilaudid and encourage oral pain medication -  PT/OT assessement -  D/C foley catheter  CAD s/p CABG and grade 1 diastolic heart failure.  Denies chest pain.  BP low normal.   -  KVO IVF -  Restart aspirin when deemed safe by surgery  -  Continue beta blocker  -  Restart ACEI as BP tolerated -  Patient is not on statin - defer to cardiology  -  Continue lasix   PAF, stable in sinus rhythm with normal rate.   -  On BB, holding ASA per cardiology  Somnolence and confusion this morning, likely due to hepatic encephalopathy and narcotic use -  Minimize narcotics -  ABG wnl and ammonia level 90 (high) -  Narcan prn -  Increase lactulose to 30gm TID  Cirrhosis, unclear etiology:  No abdominal pain or distension.  + hepatic encephalopathy -  Monitor for SBP and GIB, but mental status changes likely due to recent surgery and narcotics as above -  Continue lasix  T2DM:  FS stable -  Continue mod SSI with HS  -  Continue aspart 3 units AC  Normocytic anemia, likely due to blood loss during surgery and chronic disease -  Trend Thrombocytopenia, likely due to cirrhosis, higher than previous baseline.  No evidence of bleeding.   -  Trend  Diet:  Diabetic diet Access:  PIV IVF:  KVO Proph:  lovenox  Code Status: full code Family Communication: pending  Disposition Plan: pending PT/OT evaluation and improvement in mental status   Consultants:  Cardiology:  Dr. Mayford Knife  Orthopedics:  Dr. Shelle Iron  Procedures:  Intramedullary nailing of right hip 2/28  Antibiotics:  Perioperative clindamycin  HPI/Subjective:  Patient sleeping and has difficulty arousing and staying awake this  morning.  Difficulty following commands or answering questions.  Denies pain, chest pain, shortness of breath, nausea, vomiting.  Denies diarrhea, constipation.      Objective: Filed Vitals:   02/03/13 2134 02/04/13 0219 02/04/13 0632 02/04/13 1056  BP: 107/43 118/46 106/41 107/45  Pulse: 68 69 68 73  Temp: 98.6 F (37 C) 98.7 F (37.1 C) 99 F (37.2 C) 98 F (36.7 C)  TempSrc: Oral Oral Oral Oral  Resp: 16 18 20 20   Height:      Weight:      SpO2: 100% 98% 98% 97%    Intake/Output Summary (Last 24 hours) at 02/04/13 1100 Last data filed at 02/04/13 8119  Gross per 24 hour  Intake   2510 ml  Output   1000 ml  Net   1510 ml   Filed Weights   02/03/13 0520  Weight: 70.024 kg (154 lb 6 oz)    Exam:   General:  Average weight CM, No acute distress  HEENT:  NCAT, MMM  Cardiovascular:   RRR, nl S1, S2 no mrg, 2+ pulses, warm extremities  Respiratory:  CTAB, no increased WOB  Abdomen:   NABS, soft, NT/ND  MSK:   Normal tone and bulk, no LEE  Neuro:  Sleepy and difficulty waking up.  Confusion and difficulty following commands.  Grossly moves all extremities.  Wiggles toes on right foot.  Right leg DP palpable.  Extremity warm and dry.  Dressing  with dried blood 1/2 dollar sized.  TTP along right lateral femur/greater trochanter.    Data Reviewed: Basic Metabolic Panel:  Recent Labs Lab 02/02/13 1618 02/02/13 2132 02/03/13 0530 02/03/13 1810 02/04/13 0540  NA 143  --  145  --  143  K 3.7  --  3.4*  --  3.9  CL 106  --  113*  --  112  CO2 25  --  26  --  28  GLUCOSE 238*  --  160*  --  209*  BUN 14  --  15  --  13  CREATININE 0.57 0.63 0.51 0.62 0.55  CALCIUM 9.5  --  8.6  --  7.8*  MG  --  1.9  --   --   --   PHOS  --  1.6*  --   --   --    Liver Function Tests:  Recent Labs Lab 02/02/13 2132 02/03/13 0530  AST 20 14  ALT 10 9  ALKPHOS 96 74  BILITOT 1.5* 1.4*  PROT 6.2 5.5*  ALBUMIN 2.7* 2.6*   No results found for this basename: LIPASE,  AMYLASE,  in the last 168 hours  Recent Labs Lab 02/04/13 1009  AMMONIA 90*   CBC:  Recent Labs Lab 02/02/13 1618 02/02/13 2132 02/03/13 0530 02/03/13 1810 02/04/13 0540  WBC 8.6 7.4 7.1 8.0 6.2  NEUTROABS 6.6  --   --   --   --   HGB 13.1 12.3* 11.2* 11.1* 10.2*  HCT 37.6* 34.8* 32.3* 33.9* 30.8*  MCV 94.5 94.6 93.9 98.0 96.6  PLT 134* 126* 122* 123* 114*   Cardiac Enzymes: No results found for this basename: CKTOTAL, CKMB, CKMBINDEX, TROPONINI,  in the last 168 hours BNP (last 3 results) No results found for this basename: PROBNP,  in the last 8760 hours CBG:  Recent Labs Lab 02/03/13 1114 02/03/13 1325 02/03/13 1657 02/03/13 2138 02/04/13 0753  GLUCAP 144* 123* 109* 103* 178*    Recent Results (from the past 240 hour(s))  SURGICAL PCR SCREEN     Status: None   Collection Time    02/03/13  8:11 AM      Result Value Range Status   MRSA, PCR NEGATIVE  NEGATIVE Final   Staphylococcus aureus NEGATIVE  NEGATIVE Final   Comment:            The Xpert SA Assay (FDA     approved for NASAL specimens     in patients over 12 years of age),     is one component of     a comprehensive surveillance     program.  Test performance has     been validated by [employer] for patients greater     than or equal to 74 year old.     It is not intended     to diagnose infection nor to     guide or monitor treatment.     Studies: Dg Ribs Unilateral W/chest Right  02/02/2013  *RADIOLOGY REPORT*  Clinical Data: Fall with chest pain.  RIGHT RIBS AND CHEST - 3+ VIEW  Comparison: 04/26/2012 and prior chest radiographs  Findings: Cardiomegaly and CABG changes noted. Bibasilar atelectasis identified with scattered areas of unchanged pulmonary scarring. There are nondisplaced fractures of the right 4th - 7th ribs and are age indeterminate. There is no evidence of pleural effusion or pneumothorax. Remote appearing left 4th-8th rib fractures are present.  IMPRESSION: Nondisplaced  right fourth -  seventh rib fractures, age indeterminate.  Remote appearing fractures of the left 4th - 8th ribs but correlate clinically with pain.  Bibasilar atelectasis.   Original Report Authenticated By: Harmon Pier, M.D.    Dg Hip Complete Right  02/02/2013  *RADIOLOGY REPORT*  Clinical Data: Larey Seat, pain  RIGHT HIP - COMPLETE 2+ VIEW  Comparison: None.  Findings: Intertrochanteric fracture of the right hip.  Slight comminution.  Slight widening of the proximal aspect of the fracture.  No pelvic fractures.  Small fecal impaction.  IMPRESSION: Acute intertrochanteric fracture of the right hip.   Original Report Authenticated By: Davonna Belling, M.D.    Dg Hip Operative Right  02/03/2013  *RADIOLOGY REPORT*  Clinical Data: Fracture fixation.  DG OPERATIVE RIGHT HIP  Comparison: Plain films 02/02/2013.  Findings: We are provided with six intraoperative views of the right hip.  Images demonstrate placement of a dynamic hip screw with a Hodaya Curto IM nail for fixation of an intertrochanteric fracture. No evidence of complication.  IMPRESSION: ORIF right intertrochanteric fracture.   Original Report Authenticated By: Holley Dexter, M.D.    Dg Knee 1-2 Views Right  02/02/2013  *RADIOLOGY REPORT*  Clinical Data: Fall with right knee pain.  RIGHT KNEE - 1-2 VIEW  Comparison: None  Findings: No acute fracture, subluxation or dislocation identified. No definite effusion is identified. No focal bony lesions are present. Mild tricompartmental joint space narrowing identified.  IMPRESSION: No evidence of acute bony abnormality.   Original Report Authenticated By: Harmon Pier, M.D.    Ct Head Wo Contrast  02/02/2013  *RADIOLOGY REPORT*  Clinical Data: Larey Seat without loss of consciousness.  CT HEAD WITHOUT CONTRAST  Technique:  Contiguous axial images were obtained from the base of the skull through the vertex without contrast.  Comparison: 02/24/2005.  Findings: There is no evidence for acute infarction, intracranial  hemorrhage, mass lesion, hydrocephalus, or extra-axial fluid.  Mild atrophy.  Mild chronic microvascular ischemic change.  Calvarium intact.  Advanced vascular calcification.  There is a hyperdense retention cyst in the central compartment of the sphenoid sinus measuring 21 x 24 mm cross-section.  Variable areas of increased attenuation could represent inspissated mucus, fungal infection, or even calcification. This retention cyst does not fill the sinus at this time and there are no features to suggest a mucocele.  Mild chronic sinus disease can be seen in both maxillary sinuses, and the posterior right ethmoids.  Compared with priors, atrophy has progressed.  IMPRESSION: Mild atrophy and chronic microvascular ischemic change.  There is no skull fracture or intracranial hemorrhage.  Chronic sinusitis as described.   Original Report Authenticated By: Davonna Belling, M.D.    Dg Pelvis Portable  02/03/2013  *RADIOLOGY REPORT*  Clinical Data: 72 year old male status post right hip surgery.  PORTABLE PELVIS  Comparison: 1520 hours the same day and earlier.  Findings: Portable AP view of the pelvis 1820 hours.  New proximal right femur intramedullary rod with proximal interlocking dynamic hip screw and distal interlocking cortical screw.  Near anatomic alignment at the right intertrochanteric fracture.  Overlying postoperative soft tissue change is including skin staples.  Right femoral head normally located.  Pelvis appears intact.  IMPRESSION: ORIF right proximal femur with no adverse features.   Original Report Authenticated By: Erskine Speed, M.D.     Scheduled Meds: . enoxaparin (LOVENOX) injection  30 mg Subcutaneous Q24H  . escitalopram  10 mg Oral Daily  . folic acid  1 mg Oral Daily  . furosemide  40 mg  Oral Daily  . insulin aspart  0-15 Units Subcutaneous TID WC  . insulin aspart  0-5 Units Subcutaneous QHS  . insulin aspart  3 Units Subcutaneous TID WC  . lactulose  10 g Oral BID  . metoprolol  tartrate  25 mg Oral BID  . pantoprazole  40 mg Oral Daily  . potassium chloride SA  20 mEq Oral Daily   Continuous Infusions: . dextrose 5 % with KCl 20 mEq / L 20 mEq (02/04/13 0930)    Principal Problem:   Intertrochanteric fracture of right hip Active Problems:   Type 2 diabetes mellitus   Essential hypertension, benign   CORONARY ATHEROSCLEROSIS NATIVE CORONARY ARTERY   CIRRHOSIS   Fracture, intertrochanteric, right femur   PAF (paroxysmal atrial fibrillation)    Time spent: 30 min    Erricka Falkner  [facility] Pager [phone]. If 7PM-7AM, please contact night-coverage at [email or web address], password [facility] 02/04/2013, 11:00 AM  LOS: 2 days

## 2013-02-04 NOTE — Progress Notes (Signed)
Attempted to assess Pt.  Pt unable to participate in meaningful conversation (Pt confused).  Per RN, Pt has been confused all morning.  RN stated that she believes Pt lives with his daughter.  Called the home number listed for Pt.  No answer.  LM for Julious Payer, Montez Hageman., contact name listed on Pt's facesheet.  CSW to continue to follow.  Providence Crosby, LCSWA Clinical Social Work [phone]

## 2013-02-05 LAB — GLUCOSE, CAPILLARY
Glucose-Capillary: 167 mg/dL — ABNORMAL HIGH (ref 70–99)
Glucose-Capillary: 209 mg/dL — ABNORMAL HIGH (ref 70–99)
Glucose-Capillary: 196 mg/dL — ABNORMAL HIGH (ref 70–99)
Glucose-Capillary: 161 mg/dL — ABNORMAL HIGH (ref 70–99)

## 2013-02-05 LAB — BASIC METABOLIC PANEL
BUN: 11 mg/dL (ref 6–23)
CO2: 25 mEq/L (ref 19–32)
Chloride: 110 mEq/L (ref 96–112)
Glucose, Bld: 185 mg/dL — ABNORMAL HIGH (ref 70–99)
Potassium: 4.2 mEq/L (ref 3.5–5.1)
Sodium: 141 mEq/L (ref 135–145)

## 2013-02-05 LAB — CBC
Hemoglobin: 10 g/dL — ABNORMAL LOW (ref 13.0–17.0)
RBC: 3.09 MIL/uL — ABNORMAL LOW (ref 4.22–5.81)

## 2013-02-05 MED ORDER — LISINOPRIL 2.5 MG PO TABS
2.5000 mg | ORAL_TABLET | Freq: Every day | ORAL | Status: DC
  Administered 2013-02-05 – 2013-02-07 (×3): 2.5 mg via ORAL
  Filled 2013-02-05 (×3): qty 1

## 2013-02-05 MED ORDER — ENSURE COMPLETE PO LIQD
237.0000 mL | Freq: Two times a day (BID) | ORAL | Status: DC
  Administered 2013-02-06 – 2013-02-07 (×3): 237 mL via ORAL

## 2013-02-05 MED ORDER — LACTULOSE 10 GM/15ML PO SOLN
30.0000 g | Freq: Two times a day (BID) | ORAL | Status: DC
  Administered 2013-02-05 – 2013-02-06 (×3): 30 g via ORAL
  Filled 2013-02-05 (×5): qty 45

## 2013-02-05 MED ORDER — INSULIN GLARGINE 100 UNIT/ML ~~LOC~~ SOLN
5.0000 [IU] | Freq: Every day | SUBCUTANEOUS | Status: DC
  Administered 2013-02-05 – 2013-02-06 (×2): 5 [IU] via SUBCUTANEOUS

## 2013-02-05 NOTE — Progress Notes (Signed)
SUBJECTIVE:  Doing well no chest pain   OBJECTIVE:   Vitals:   Filed Vitals:   02/04/13 2152 02/05/13 0000 02/05/13 0649 02/05/13 0726  BP: 120/41  120/46   Pulse: 78  71   Temp: 99.2 F (37.3 C)  98.4 F (36.9 C)   TempSrc: Oral  Oral   Resp: 20 18 18    Height:      Weight:    80.5 kg (177 lb 7.5 oz)  SpO2: 96% 96% 93%    I&O's:   Intake/Output Summary (Last 24 hours) at 02/05/13 0800 Last data filed at 02/05/13 4098  Gross per 24 hour  Intake  691.5 ml  Output    450 ml  Net  241.5 ml   TELEMETRY: Reviewed telemetry pt in NSR:     PHYSICAL EXAM General: Well developed, well nourished, in no acute distress Head: Eyes PERRLA, No xanthomas.   Normal cephalic and atramatic  Lungs:   Clear bilaterally to auscultation and percussion. Heart:   HRRR S1 S2 Pulses are 2+ & equal. Abdomen: Bowel sounds are positive, abdomen soft and non-tender without masse Extremities:   No clubbing, cyanosis or edema.  DP +1 Neuro: Alert and oriented X 3. Psych:  Good affect, responds appropriately   LABS: Basic Metabolic Panel:  Recent Labs  11/91/47 1618 02/02/13 2132  02/04/13 0540 02/05/13 0442  NA 143  --   < > 143 141  K 3.7  --   < > 3.9 4.2  CL 106  --   < > 112 110  CO2 25  --   < > 28 25  GLUCOSE 238*  --   < > 209* 185*  BUN 14  --   < > 13 11  CREATININE 0.57 0.63  < > 0.55 0.57  CALCIUM 9.5  --   < > 7.8* 7.9*  MG  --  1.9  --   --   --   PHOS  --  1.6*  --   --   --   < > = values in this interval not displayed. Liver Function Tests:  Recent Labs  02/02/13 2132 02/03/13 0530  AST 20 14  ALT 10 9  ALKPHOS 96 74  BILITOT 1.5* 1.4*  PROT 6.2 5.5*  ALBUMIN 2.7* 2.6*   No results found for this basename: LIPASE, AMYLASE,  in the last 72 hours CBC:  Recent Labs  02/02/13 1618  02/04/13 0540 02/05/13 0442  WBC 8.6  < > 6.2 6.0  NEUTROABS 6.6  --   --   --   HGB 13.1  < > 10.2* 10.0*  HCT 37.6*  < > 30.8* 29.5*  MCV 94.5  < > 96.6 95.5  PLT 134*  <  > 114* 127*  < > = values in this interval not displayed. Cardiac Enzymes: No results found for this basename: CKTOTAL, CKMB, CKMBINDEX, TROPONINI,  in the last 72 hours BNP: No components found with this basename: POCBNP,  D-Dimer: No results found for this basename: DDIMER,  in the last 72 hours Hemoglobin A1C:  Recent Labs  02/02/13 2132  HGBA1C 7.0*   Fasting Lipid Panel: No results found for this basename: CHOL, HDL, LDLCALC, TRIG, CHOLHDL, LDLDIRECT,  in the last 72 hours Thyroid Function Tests: No results found for this basename: TSH, T4TOTAL, FREET3, T3FREE, THYROIDAB,  in the last 72 hours Anemia Panel: No results found for this basename: VITAMINB12, FOLATE, FERRITIN, TIBC, IRON, RETICCTPCT,  in the last  72 hours Coag Panel:   Lab Results  Component Value Date   INR 1.52* 02/03/2013   INR 1.31 02/02/2013   INR 1.0 12/22/2010    RADIOLOGY: Dg Ribs Unilateral W/chest Right  02/02/2013  *RADIOLOGY REPORT*  Clinical Data: Fall with chest pain.  RIGHT RIBS AND CHEST - 3+ VIEW  Comparison: 04/26/2012 and prior chest radiographs  Findings: Cardiomegaly and CABG changes noted. Bibasilar atelectasis identified with scattered areas of unchanged pulmonary scarring. There are nondisplaced fractures of the right 4th - 7th ribs and are age indeterminate. There is no evidence of pleural effusion or pneumothorax. Remote appearing left 4th-8th rib fractures are present.  IMPRESSION: Nondisplaced right fourth - seventh rib fractures, age indeterminate.  Remote appearing fractures of the left 4th - 8th ribs but correlate clinically with pain.  Bibasilar atelectasis.   Original Report Authenticated By: Harmon Pier, M.D.    Dg Hip Complete Right  02/02/2013  *RADIOLOGY REPORT*  Clinical Data: Larey Seat, pain  RIGHT HIP - COMPLETE 2+ VIEW  Comparison: None.  Findings: Intertrochanteric fracture of the right hip.  Slight comminution.  Slight widening of the proximal aspect of the fracture.  No pelvic  fractures.  Small fecal impaction.  IMPRESSION: Acute intertrochanteric fracture of the right hip.   Original Report Authenticated By: Davonna Belling, M.D.    Dg Hip Operative Right  02/03/2013  *RADIOLOGY REPORT*  Clinical Data: Fracture fixation.  DG OPERATIVE RIGHT HIP  Comparison: Plain films 02/02/2013.  Findings: We are provided with six intraoperative views of the right hip.  Images demonstrate placement of a dynamic hip screw with a short IM nail for fixation of an intertrochanteric fracture. No evidence of complication.  IMPRESSION: ORIF right intertrochanteric fracture.   Original Report Authenticated By: Holley Dexter, M.D.    Dg Knee 1-2 Views Right  02/02/2013  *RADIOLOGY REPORT*  Clinical Data: Fall with right knee pain.  RIGHT KNEE - 1-2 VIEW  Comparison: None  Findings: No acute fracture, subluxation or dislocation identified. No definite effusion is identified. No focal bony lesions are present. Mild tricompartmental joint space narrowing identified.  IMPRESSION: No evidence of acute bony abnormality.   Original Report Authenticated By: Harmon Pier, M.D.    Ct Head Wo Contrast  02/02/2013  *RADIOLOGY REPORT*  Clinical Data: Larey Seat without loss of consciousness.  CT HEAD WITHOUT CONTRAST  Technique:  Contiguous axial images were obtained from the base of the skull through the vertex without contrast.  Comparison: 02/24/2005.  Findings: There is no evidence for acute infarction, intracranial hemorrhage, mass lesion, hydrocephalus, or extra-axial fluid.  Mild atrophy.  Mild chronic microvascular ischemic change.  Calvarium intact.  Advanced vascular calcification.  There is a hyperdense retention cyst in the central compartment of the sphenoid sinus measuring 21 x 24 mm cross-section.  Variable areas of increased attenuation could represent inspissated mucus, fungal infection, or even calcification. This retention cyst does not fill the sinus at this time and there are no features to suggest a  mucocele.  Mild chronic sinus disease can be seen in both maxillary sinuses, and the posterior right ethmoids.  Compared with priors, atrophy has progressed.  IMPRESSION: Mild atrophy and chronic microvascular ischemic change.  There is no skull fracture or intracranial hemorrhage.  Chronic sinusitis as described.   Original Report Authenticated By: Davonna Belling, M.D.    Dg Pelvis Portable  02/03/2013  *RADIOLOGY REPORT*  Clinical Data: 72 year old male status post right hip surgery.  PORTABLE PELVIS  Comparison: 1520 hours the  same day and earlier.  Findings: Portable AP view of the pelvis 1820 hours.  New proximal right femur intramedullary rod with proximal interlocking dynamic hip screw and distal interlocking cortical screw.  Near anatomic alignment at the right intertrochanteric fracture.  Overlying postoperative soft tissue change is including skin staples.  Right femoral head normally located.  Pelvis appears intact.  IMPRESSION: ORIF right proximal femur with no adverse features.   Original Report Authenticated By: Erskine Speed, M.D.      ASSESSMENT AND PLAN  1. R Intertrochanteric Hip Fx s/p nailing  2. CAD: continue  beta blocker. Awaiting ok by ortho to restart ASA - ? Why patient not on a statin - will defer to primary Cardiologist to address 3. H/O PAF: In sinus. Cont bb.  4. DM: Cont ssi.   Quintella Reichert, MD  02/05/2013  8:00 AM

## 2013-02-05 NOTE — Progress Notes (Signed)
Clinical Social Work Department CLINICAL SOCIAL WORK PLACEMENT NOTE 02/05/2013  Patient:  Anthony Page, Anthony Page  Account Number:  [id number] Admit date:  02/02/2013  Clinical Social Worker:  Doroteo Glassman  Date/time:  02/05/2013 10:01 AM  Clinical Social Work is seeking post-discharge placement for this patient at the following level of care:   SKILLED NURSING   (*CSW will update this form in Epic as items are completed)   Will give with offers  Patient/family provided with [facility] Department of Clinical Social Work's list of facilities offering this level of care within the geographic area requested by the patient (or if unable, by the patient's family).  02/05/2013  Patient/family informed of their freedom to choose among providers that offer the needed level of care, that participate in Medicare, Medicaid or managed care program needed by the patient, have an available bed and are willing to accept the patient.  02/05/2013  Patient/family informed of [facility]' ownership interest in [facility], as well as of the fact that they are under no obligation to receive care at this facility.  PASARR submitted to EDS on existing PASARR number received from EDS on existing  FL2 transmitted to all facilities in geographic area requested by pt/family on  02/05/2013 FL2 transmitted to all facilities within larger geographic area on   Patient informed that his/her managed care company has contracts with or will negotiate with  certain facilities, including the following:     Patient/family informed of bed offers received:   Patient chooses bed at  Physician recommends and patient chooses bed at    Patient to be transferred to  on   Patient to be transferred to facility by   The following physician request were entered in Epic:   Additional Comments:  CSW to continue to follow.  Providence Crosby, LCSWA Clinical Social Work [phone]
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
## 2013-02-05 NOTE — Progress Notes (Addendum)
INITIAL NUTRITION ASSESSMENT  DOCUMENTATION CODES Per approved criteria  -Not Applicable   INTERVENTION: Ensure Complete po BID, each supplement provides 350 kcal and 13 grams of protein.   NUTRITION DIAGNOSIS: Inadequate oral intake related to chronic illness as evidenced by po intake of 25-50%.  Goal: Pt to meet >/= 90% of their estimated nutrition needs.  Monitor:  Oral intake Weight  Reason for Assessment: MST  72 y.o. male  Admitting Dx: Intertrochanteric fracture of right hip  ASSESSMENT: Unable to speak with pt. Pt was unarousable and no family was present.  Spoke with nurse who reported that pt's po intake was improving and that he would probably like to receive ensure complete.  Per MST note, pt reports weight loss of 65 lbs in 6 months.  Unable to confirm with pt or family. Will send ensure BID. Pt is admitted for hip fracture.  Height: Ht Readings from Last 1 Encounters:  02/03/13 5\' 6"  (1.676 m)    Weight: Wt Readings from Last 1 Encounters:  02/05/13 177 lb 7.5 oz (80.5 kg)    Ideal Body Weight: 68.3 kg  % Ideal Body Weight: 118%  Wt Readings from Last 10 Encounters:  02/05/13 177 lb 7.5 oz (80.5 kg)  02/05/13 177 lb 7.5 oz (80.5 kg)  12/19/12 170 lb (77.111 kg)  06/16/12 176 lb (79.833 kg)  04/26/12 172 lb (78.019 kg)  06/29/11 181 lb 6.4 oz (82.283 kg)  12/22/10 188 lb (85.276 kg)  10/13/10 187 lb (84.823 kg)  02/17/10 184 lb (83.462 kg)    Usual Body Weight: unknown  % Usual Body Weight: unknown  BMI:  Body mass index is 28.66 kg/(m^2).  Estimated Nutritional Needs: Kcal: 2000-2200 Protein: 80-100 g  Fluid: >2.2 L  Skin: Incision on right hip, red linear marks on buttocks  Diet Order: Carb Control  EDUCATION NEEDS: -No education needs identified at this time   Intake/Output Summary (Last 24 hours) at 02/05/13 1630 Last data filed at 02/05/13 1447  Gross per 24 hour  Intake  691.5 ml  Output    450 ml  Net  241.5 ml     Last BM: 3/2   Labs:   Recent Labs Lab 02/02/13 1618 02/02/13 2132 02/03/13 0530 02/03/13 1810 02/04/13 0540 02/05/13 0442  NA 143  --  145  --  143 141  K 3.7  --  3.4*  --  3.9 4.2  CL 106  --  113*  --  112 110  CO2 25  --  26  --  28 25  BUN 14  --  15  --  13 11  CREATININE 0.57 0.63 0.51 0.62 0.55 0.57  CALCIUM 9.5  --  8.6  --  7.8* 7.9*  MG  --  1.9  --   --   --   --   PHOS  --  1.6*  --   --   --   --   GLUCOSE 238*  --  160*  --  209* 185*    CBG (last 3)   Recent Labs  02/04/13 2200 02/05/13 0743 02/05/13 1203  GLUCAP 181* 167* 209*    Scheduled Meds: . enoxaparin (LOVENOX) injection  30 mg Subcutaneous Q24H  . escitalopram  10 mg Oral Daily  . folic acid  1 mg Oral Daily  . furosemide  40 mg Oral Daily  . insulin aspart  0-15 Units Subcutaneous TID WC  . insulin aspart  0-5 Units Subcutaneous QHS  .  insulin aspart  3 Units Subcutaneous TID WC  . insulin glargine  5 Units Subcutaneous QHS  . lactulose  30 g Oral BID  . lisinopril  2.5 mg Oral Daily  . metoprolol tartrate  25 mg Oral BID  . pantoprazole  40 mg Oral Daily  . potassium chloride SA  20 mEq Oral Daily    Continuous Infusions: . dextrose 5 % with KCl 20 mEq / L 20 mEq (02/04/13 0930)    Past Medical History  Diagnosis Date  . Coronary atherosclerosis of native coronary artery     Multivessel status post CABG  . Cirrhosis   . Paroxysmal atrial fibrillation     Postoperative  . GERD (gastroesophageal reflux disease)   . Essential hypertension, benign   . DM2 (diabetes mellitus, type 2)   . Dyslipidemia   . Meniere disease     Partial deafness  . Back fracture     Severe fractures of the feet and back from traumatic fall w/long-term disabilites    Past Surgical History  Procedure Laterality Date  . Coronary artery bypass graft  2011    LIMA to LAD, SVG to diagonal, SVG to OM, SVG to RCA  . Portacaval shunt    . Left inguinal hernia      Ebbie Latus RD, LDN

## 2013-02-05 NOTE — Progress Notes (Signed)
[facility] PROGRESS NOTE  Anthony Page WJX:[id number] DOB: Jul 26, 1941 DOA: 02/02/2013 PCP: Josue Hector, MD  Assessment/Plan  Right intertrochanteric hip fracture s/p intramedullary nailing 2/28.   -  POD 2 -  Pain control:  Patient less sedated this morning on oral pain medications -  PT/OT recommending SNF (SW has seen and met with family)  CAD s/p CABG and grade 1 diastolic heart failure.  Denies chest pain.  BP low normal.   -  Restart aspirin when deemed safe by surgery  -  Continue beta blocker  -  Restart low dose ACEI  -  Patient is not on statin - defer to primary cardiologist -  Continue lasix   PAF, stable in sinus rhythm with normal rate x 2 days -  On BB, holding ASA per cardiology -  D/c telemetry  Somnolence and confusion improving, likely due to hepatic encephalopathy and narcotic use and resolving with lactulose and minimizing narcotics. -  ABG wnl and ammonia level 90 (high) -  Narcan prn -  Decrease lactulose to BID  Cirrhosis, unclear etiology:  No abdominal pain or distension.  + hepatic encephalopathy -  Monitor for SBP and GIB, but mental status changes likely due to recent surgery and narcotics as above -  Continue lasix  T2DM:  FS stable -  Continue mod SSI with HS  -  Continue aspart 3 units AC -  Add low dose lantus 5 units  Normocytic anemia, likely due to blood loss during surgery and chronic disease -  Trend Thrombocytopenia, likely due to cirrhosis, higher than previous baseline.  No evidence of bleeding.   -  Trend  Diet:  Diabetic diet Access:  PIV IVF:  KVO Proph:  lovenox  Code Status: full code Family Communication: pending  Disposition Plan:  To SNF when bed available.   Consultants:  Cardiology:  Dr. Mayford Knife  Orthopedics:  Dr. Shelle Iron  Procedures:  Intramedullary nailing of right hip 2/28  Antibiotics:  Perioperative clindamycin  HPI/Subjective:  Patient awake this morning. Improved ability to  follow ommands and answer questions.  Denies pain, chest pain, shortness of breath, nausea, vomiting.  Denies diarrhea, constipation.      Objective: Filed Vitals:   02/04/13 2152 02/05/13 0000 02/05/13 0649 02/05/13 0726  BP: 120/41  120/46   Pulse: 78  71   Temp: 99.2 F (37.3 C)  98.4 F (36.9 C)   TempSrc: Oral  Oral   Resp: 20 18 18    Height:      Weight:    80.5 kg (177 lb 7.5 oz)  SpO2: 96% 96% 93%     Intake/Output Summary (Last 24 hours) at 02/05/13 1319 Last data filed at 02/05/13 1020  Gross per 24 hour  Intake  691.5 ml  Output    450 ml  Net  241.5 ml   Filed Weights   02/03/13 0520 02/05/13 0726  Weight: 70.024 kg (154 lb 6 oz) 80.5 kg (177 lb 7.5 oz)    Exam:   General:  Average weight CM, No acute distress  HEENT:  NCAT, MMM  Cardiovascular:   RRR, nl S1, S2 no mrg, 2+ pulses, warm extremities  Respiratory:  CTAB, no increased WOB  Abdomen:   NABS, soft, NT/ND  MSK:   Normal tone and bulk, no LEE.  Wiggles toes on right foot.  Right leg DP palpable.  Extremity warm and dry.  Dressing with dried blood 1/2 dollar sized stable from yesterday.  TTP along right  lateral femur/greater trochanter.    Neuro:  Mild persistent confusion and but less difficulty following commands.  Grossly moves all extremities.     Data Reviewed: Basic Metabolic Panel:  Recent Labs Lab 02/02/13 1618 02/02/13 2132 03/02/2013 0530 03/02/13 1810 02/04/13 0540 02/05/13 0442  NA 143  --  145  --  143 141  K 3.7  --  3.4*  --  3.9 4.2  CL 106  --  113*  --  112 110  CO2 25  --  26  --  28 25  GLUCOSE 238*  --  160*  --  209* 185*  BUN 14  --  15  --  13 11  CREATININE 0.57 0.63 0.51 0.62 0.55 0.57  CALCIUM 9.5  --  8.6  --  7.8* 7.9*  MG  --  1.9  --   --   --   --   PHOS  --  1.6*  --   --   --   --    Liver Function Tests:  Recent Labs Lab 02/02/13 2132 2013-03-02 0530  AST 20 14  ALT 10 9  ALKPHOS 96 74  BILITOT 1.5* 1.4*  PROT 6.2 5.5*  ALBUMIN 2.7* 2.6*    No results found for this basename: LIPASE, AMYLASE,  in the last 168 hours  Recent Labs Lab 02/04/13 1009  AMMONIA 90*   CBC:  Recent Labs Lab 02/02/13 1618 02/02/13 2132 Mar 02, 2013 0530 02-Mar-2013 1810 02/04/13 0540 02/05/13 0442  WBC 8.6 7.4 7.1 8.0 6.2 6.0  NEUTROABS 6.6  --   --   --   --   --   HGB 13.1 12.3* 11.2* 11.1* 10.2* 10.0*  HCT 37.6* 34.8* 32.3* 33.9* 30.8* 29.5*  MCV 94.5 94.6 93.9 98.0 96.6 95.5  PLT 134* 126* 122* 123* 114* 127*   Cardiac Enzymes: No results found for this basename: CKTOTAL, CKMB, CKMBINDEX, TROPONINI,  in the last 168 hours BNP (last 3 results) No results found for this basename: PROBNP,  in the last 8760 hours CBG:  Recent Labs Lab 02/04/13 1159 02/04/13 1704 02/04/13 2200 02/05/13 0743 02/05/13 1203  GLUCAP 153* 159* 181* 167* 209*    Recent Results (from the past 240 hour(s))  SURGICAL PCR SCREEN     Status: None   Collection Time    02-Mar-2013  8:11 AM      Result Value Range Status   MRSA, PCR NEGATIVE  NEGATIVE Final   Staphylococcus aureus NEGATIVE  NEGATIVE Final   Comment:            The Xpert SA Assay (FDA     approved for NASAL specimens     in patients over 13 years of age),     is one component of     a comprehensive surveillance     program.  Test performance has     been validated by [employer] for patients greater     than or equal to 94 year old.     It is not intended     to diagnose infection nor to     guide or monitor treatment.     Studies: Dg Hip Operative Right  03/02/2013  *RADIOLOGY REPORT*  Clinical Data: Fracture fixation.  DG OPERATIVE RIGHT HIP  Comparison: Plain films 02/02/2013.  Findings: We are provided with six intraoperative views of the right hip.  Images demonstrate placement of a dynamic hip screw with a short IM nail  for fixation of an intertrochanteric fracture. No evidence of complication.  IMPRESSION: ORIF right intertrochanteric fracture.   Original Report Authenticated  By: Holley Dexter, M.D.    Dg Pelvis Portable  02/03/2013  *RADIOLOGY REPORT*  Clinical Data: 72 year old male status post right hip surgery.  PORTABLE PELVIS  Comparison: 1520 hours the same day and earlier.  Findings: Portable AP view of the pelvis 1820 hours.  New proximal right femur intramedullary rod with proximal interlocking dynamic hip screw and distal interlocking cortical screw.  Near anatomic alignment at the right intertrochanteric fracture.  Overlying postoperative soft tissue change is including skin staples.  Right femoral head normally located.  Pelvis appears intact.  IMPRESSION: ORIF right proximal femur with no adverse features.   Original Report Authenticated By: Erskine Speed, M.D.     Scheduled Meds: . enoxaparin (LOVENOX) injection  30 mg Subcutaneous Q24H  . escitalopram  10 mg Oral Daily  . folic acid  1 mg Oral Daily  . furosemide  40 mg Oral Daily  . insulin aspart  0-15 Units Subcutaneous TID WC  . insulin aspart  0-5 Units Subcutaneous QHS  . insulin aspart  3 Units Subcutaneous TID WC  . lactulose  30 g Oral TID  . metoprolol tartrate  25 mg Oral BID  . pantoprazole  40 mg Oral Daily  . potassium chloride SA  20 mEq Oral Daily   Continuous Infusions: . dextrose 5 % with KCl 20 mEq / L 20 mEq (02/04/13 0930)    Principal Problem:   Intertrochanteric fracture of right hip Active Problems:   Type 2 diabetes mellitus   Essential hypertension, benign   CORONARY ATHEROSCLEROSIS NATIVE CORONARY ARTERY   CIRRHOSIS   Fracture, intertrochanteric, right femur   PAF (paroxysmal atrial fibrillation)   Encephalopathy, hepatic    Time spent: 30 min    SHORT, MACKENZIE  [facility] Pager [phone]. If 7PM-7AM, please contact night-coverage at [email or web address], password [facility] 02/05/2013, 1:19 PM  LOS: 3 days

## 2013-02-05 NOTE — Progress Notes (Signed)
Per RN, Pt alert and oriented.  Pt wanting CSW to notarize his financial POA document.  Pt able to explain the document and his need for it.  Notarized Pt's financial POA.  Left it in Pt's room on the windowsill.  Notified Pt's son.  Unit CSW to f/u with SNF bed offers.  Providence Crosby, LCSWA Clinical Social Work [phone]

## 2013-02-05 NOTE — Progress Notes (Signed)
   Subjective: 2 Days Post-Op Procedure(s) (LRB): INTRAMEDULLARY (IM) NAIL FEMORAL (Right)   Patient reports pain as mild in the right hip Pt feeling better today More alert today on exam  Objective:   VITALS:   Filed Vitals:   02/05/13 0649  BP: 120/46  Pulse: 71  Temp: 98.4 F (36.9 C)  Resp: 18   Right hip incision healing well Neurologically intact distally No erythema or drainage  LABS  Recent Labs  02/03/13 1810 02/04/13 0540 02/05/13 0442  HGB 11.1* 10.2* 10.0*  HCT 33.9* 30.8* 29.5*  WBC 8.0 6.2 6.0  PLT 123* 114* 127*     Recent Labs  02/03/13 0530 02/03/13 1810 02/04/13 0540 02/05/13 0442  NA 145  --  143 141  K 3.4*  --  3.9 4.2  BUN 15  --  13 11  CREATININE 0.51 0.62 0.55 0.57  GLUCOSE 160*  --  209* 185*     Assessment/Plan: 2 Days Post-Op Procedure(s) (LRB): INTRAMEDULLARY (IM) NAIL FEMORAL (Right)  PT/OT Pain control as needed D/c planning Up with therapy   Alphonsa Overall, MPAS, PA-C  02/05/2013, 7:46 AM

## 2013-02-06 ENCOUNTER — Inpatient Hospital Stay (HOSPITAL_COMMUNITY): Payer: Medicare Other

## 2013-02-06 ENCOUNTER — Encounter (HOSPITAL_COMMUNITY): Payer: Self-pay | Admitting: Specialist

## 2013-02-06 DIAGNOSIS — I4891 Unspecified atrial fibrillation: Secondary | ICD-10-CM

## 2013-02-06 LAB — GLUCOSE, CAPILLARY
Glucose-Capillary: 154 mg/dL — ABNORMAL HIGH (ref 70–99)
Glucose-Capillary: 253 mg/dL — ABNORMAL HIGH (ref 70–99)

## 2013-02-06 LAB — CBC
MCV: 94.8 fL (ref 78.0–100.0)
Platelets: 123 10*3/uL — ABNORMAL LOW (ref 150–400)
RDW: 14 % (ref 11.5–15.5)
WBC: 6.9 10*3/uL (ref 4.0–10.5)

## 2013-02-06 MED ORDER — GUAIFENESIN-DM 100-10 MG/5ML PO SYRP: 10.0000 mL | ORAL_SOLUTION | Freq: Four times a day (QID) | ORAL | Status: DC | PRN

## 2013-02-06 MED ORDER — ENOXAPARIN SODIUM 40 MG/0.4ML ~~LOC~~ SOLN
40.0000 mg | SUBCUTANEOUS | Status: DC
  Administered 2013-02-06 – 2013-02-07 (×2): 40 mg via SUBCUTANEOUS
  Filled 2013-02-06 (×3): qty 0.4

## 2013-02-06 MED ORDER — HYDROCODONE-ACETAMINOPHEN 5-325 MG PO TABS: 1.0000 | ORAL_TABLET | Freq: Four times a day (QID) | ORAL | Status: DC | PRN

## 2013-02-06 MED ORDER — ENOXAPARIN SODIUM 30 MG/0.3ML ~~LOC~~ SOLN: 40.0000 mg | SUBCUTANEOUS | Status: DC

## 2013-02-06 MED ORDER — DM-GUAIFENESIN ER 30-600 MG PO TB12
1.0000 | ORAL_TABLET | Freq: Two times a day (BID) | ORAL | Status: DC | PRN
  Administered 2013-02-06: 1 via ORAL
  Filled 2013-02-06: qty 1

## 2013-02-06 MED ORDER — ATORVASTATIN CALCIUM 10 MG PO TABS
10.0000 mg | ORAL_TABLET | Freq: Every day | ORAL | Status: DC
  Administered 2013-02-06: 10 mg via ORAL
  Filled 2013-02-06 (×2): qty 1

## 2013-02-06 MED ORDER — ASPIRIN EC 81 MG PO TBEC
81.0000 mg | DELAYED_RELEASE_TABLET | Freq: Every day | ORAL | Status: DC
  Administered 2013-02-06 – 2013-02-07 (×2): 81 mg via ORAL
  Filled 2013-02-06 (×2): qty 1

## 2013-02-06 NOTE — Evaluation (Signed)
Occupational Therapy Evaluation Patient Details Name: Anthony Page MRN: [id number] DOB: 08-17-1941 Today's Date: 02/06/2013 Time: 7829-5621 OT Time Calculation (min): 27 min  OT Assessment / Plan / Recommendation Clinical Impression  Pleasant cooperative 72 yr old gentleman admitted after fall resulting in right hip fracture.  Pt now with increased need for assistance with all mobility and selfcare tasks at a min to mod assist level.  Feel he will benefit from acute care OT to help increase overall independence with basic selfcare tasks.  Pt will likely need SNF for follow-up therapy to reach modified independent level since he does not have 24 hour supevision at home.      OT Assessment  Patient needs continued OT Services    Follow Up Recommendations  SNF    Barriers to Discharge Decreased caregiver support    Equipment Recommendations  3 in 1 bedside comode;Tub/shower bench       Frequency  Min 2X/week    Precautions / Restrictions Precautions Precautions: Fall Restrictions Weight Bearing Restrictions: Yes RLE Weight Bearing: Touchdown weight bearing Other Position/Activity Restrictions: pt also with PWB order and PWB written in today's ortho note   Pertinent Vitals/Pain HR 72 and O2 sats 92% on room air during session, pain 2/10 in the right leg with weightbearing.    ADL  Eating/Feeding: Simulated;Independent Where Assessed - Eating/Feeding: Chair Grooming: Simulated;Set up Where Assessed - Grooming: Unsupported sitting Upper Body Bathing: Simulated;Supervision/safety Where Assessed - Upper Body Bathing: Unsupported sitting Lower Body Bathing: Simulated;Moderate assistance Where Assessed - Lower Body Bathing: Supported sit to stand Upper Body Dressing: Simulated;Set up Where Assessed - Upper Body Dressing: Unsupported sitting Lower Body Dressing: Performed;Moderate assistance Where Assessed - Lower Body Dressing: Supported sit to stand Toilet Transfer:  Mining engineer Method: Surveyor, minerals: Other (comment) (simulated to bedside chair) Toileting - Clothing Manipulation and Hygiene: Simulated;Minimal assistance Where Assessed - Engineer, mining and Hygiene: Other (comment) (sit to stand from the EOB) Tub/Shower Transfer Method: Not assessed Equipment Used: Rolling walker;Gait belt Transfers/Ambulation Related to ADLs: Pt overall min assist level for mobility using the RW but currently only able to take a few small steps and demonstrates decreased step length on left side secondary to not being able to tolerate weightbearing on the right. ADL Comments: Pt needing mod assist for LB dressing secondary to not being able to adequately reach the RLE.  May benefit from AE for LB selfcare.      OT Diagnosis: Generalized weakness;Acute pain  OT Problem List: Decreased strength;Decreased activity tolerance;Impaired balance (sitting and/or standing);Decreased knowledge of use of DME or AE;Decreased knowledge of precautions;Pain OT Treatment Interventions: Self-care/ADL training;Therapeutic activities;Patient/family education;DME and/or AE instruction;Balance training   OT Goals Acute Rehab OT Goals OT Goal Formulation: With patient Time For Goal Achievement: 02/20/13 Potential to Achieve Goals: Good ADL Goals Pt Will Perform Grooming: with supervision;Standing at sink ADL Goal: Grooming - Progress: Goal set today Pt Will Perform Lower Body Bathing: with supervision;Sit to stand from bed;with adaptive equipment ADL Goal: Lower Body Bathing - Progress: Goal set today Pt Will Perform Lower Body Dressing: with supervision;with adaptive equipment;Sit to stand from bed ADL Goal: Lower Body Dressing - Progress: Goal set today Pt Will Transfer to Toilet: with supervision;3-in-1;Maintaining weight bearing status;with DME Pt Will Perform Toileting - Clothing Manipulation: with  supervision;Sitting on 3-in-1 or toilet;Standing ADL Goal: Toileting - Clothing Manipulation - Progress: Goal set today Pt Will Perform Toileting - Hygiene: with supervision;Sit to stand from 3-in-1/toilet  ADL Goal: Toileting - Hygiene - Progress: Goal set today Miscellaneous OT Goals Miscellaneous OT Goal #1: Pt will transfer from supine to sit EOB in preparation for selfcare tasks with supervision. OT Goal: Miscellaneous Goal #1 - Progress: Goal set today  Visit Information  Last OT Received On: 02/06/13 Assistance Needed: +1    Subjective Data  Subjective: I have a hard time hearing. Patient Stated Goal: Get back to doing stuff around his house.      Vision/Perception Vision - History Baseline Vision: Bifocals Patient Visual Report: No change from baseline Vision - Assessment Eye Alignment: Within Functional Limits Vision Assessment: Vision not tested Perception Perception: Within Functional Limits Praxis Praxis: Intact   Cognition  Cognition Overall Cognitive Status: Appears within functional limits for tasks assessed/performed Arousal/Alertness: Awake/alert Orientation Level: Appears intact for tasks assessed Behavior During Session: [facility] for tasks performed    Extremity/Trunk Assessment Right Upper Extremity Assessment RUE ROM/Strength/Tone: Within functional levels RUE Sensation: WFL - Light Touch RUE Coordination: WFL - gross/fine motor Left Upper Extremity Assessment LUE ROM/Strength/Tone: Within functional levels LUE Sensation: WFL - Light Touch LUE Coordination: WFL - gross/fine motor Trunk Assessment Trunk Assessment: Normal     Mobility Bed Mobility Bed Mobility: Supine to Sit Supine to Sit: 1: +2 Total assist;HOB elevated Supine to Sit: Patient Percentage: 40% Details for Bed Mobility Assistance: increased cues for technique which pt with difficulty performing given time so assist required, bed pad used to assist with scooting hips EOB Transfers Sit  to Stand: 4: Min assist;From bed;With upper extremity assist Stand to Sit: 4: Min assist;To chair/3-in-1;With upper extremity assist Details for Transfer Assistance: increased verbal cues for safe technique     Exercise Total Joint Exercises Ankle Circles/Pumps: AROM;Both;10 reps Quad Sets: AROM;Both;10 reps Short Arc Quad: AROM;10 reps;Right Heel Slides: AAROM;Right;10 reps Hip ABduction/ADduction: AAROM;Right;10 reps   Balance Balance Balance Assessed: Yes Dynamic Standing Balance Dynamic Standing - Balance Support: Bilateral upper extremity supported Dynamic Standing - Level of Assistance: 4: Min assist   End of Session OT - End of Session Equipment Utilized During Treatment: Gait belt Activity Tolerance: Patient limited by pain;Patient limited by fatigue Patient left: in chair Nurse Communication: Mobility status     MCGUIRE,JAMES OTR/L Pager number [phone] 02/06/2013, 12:18 PM

## 2013-02-06 NOTE — Progress Notes (Signed)
Physical Therapy Treatment Patient Details Name: Anthony Page MRN: [id number] DOB: 1941-06-27 Today's Date: 02/06/2013 Time: 4098-1191 PT Time Calculation (min): 32 min  PT Assessment / Plan / Recommendation Comments on Treatment Session  Pt attempted ambulation today however limited by nausea.  Pt also performed exercises in recliner.  Pt encouraged to perform ankle pumps throughout the day.    Follow Up Recommendations  SNF     Does the patient have the potential to tolerate intense rehabilitation     Barriers to Discharge        Equipment Recommendations  Rolling walker with 5" wheels;Wheelchair (measurements PT)    Recommendations for Other Services    Frequency     Plan Discharge plan remains appropriate;Frequency remains appropriate    Precautions / Restrictions Precautions Precautions: Fall Restrictions Weight Bearing Restrictions: Yes RLE Weight Bearing: Touchdown weight bearing Other Position/Activity Restrictions: pt also with PWB order and PWB written in today's ortho note   Pertinent Vitals/Pain No pain at rest, RN notified of pt request for pain meds after session    Mobility  Bed Mobility Bed Mobility: Supine to Sit Supine to Sit: 1: +2 Total assist;HOB elevated Supine to Sit: Patient Percentage: 40% Details for Bed Mobility Assistance: increased cues for technique which pt with difficulty performing given time so assist required, bed pad used to assist with scooting hips EOB Transfers Transfers: Sit to Stand;Stand to Sit Sit to Stand: 4: Min assist;From bed;With upper extremity assist Stand to Sit: 4: Min assist;To chair/3-in-1;With upper extremity assist Details for Transfer Assistance: increased verbal cues for safe technique Ambulation/Gait Ambulation/Gait Assistance: 4: Min assist Ambulation Distance (Feet): 4 Feet Assistive device: Rolling walker Ambulation/Gait Assistance Details: verbal cues for sequence, RW distance and increased cues to use UEs  through RW to assist with pain in R LE, pt able to tolerate PWB status, limited distance due to nausea Gait Pattern: Step-to pattern;Decreased stance time - right;Antalgic    Exercises Total Joint Exercises Ankle Circles/Pumps: AROM;Both;10 reps Quad Sets: AROM;Both;10 reps Short Arc Quad: AROM;10 reps;Right Heel Slides: AAROM;Right;10 reps Hip ABduction/ADduction: AAROM;Right;10 reps   PT Diagnosis:    PT Problem List:   PT Treatment Interventions:     PT Goals Acute Rehab PT Goals PT Goal: Supine/Side to Sit - Progress: Progressing toward goal PT Goal: Stand to Sit - Progress: Progressing toward goal PT Goal: Ambulate - Progress: Progressing toward goal  Visit Information  Last PT Received On: 02/06/13 Assistance Needed: +2 PT/OT Co-Evaluation/Treatment: Yes    Subjective Data  Subjective: I don't want anything strong...it gives you bad dreams (in regards to pain meds)   Cognition  Cognition Overall Cognitive Status: Appears within functional limits for tasks assessed/performed Arousal/Alertness: Awake/alert    Balance     End of Session PT - End of Session Equipment Utilized During Treatment: Gait belt Activity Tolerance: Patient tolerated treatment well Patient left: in chair;with call bell/phone within reach;with chair alarm set   GP     LEMYRE,KATHrine E 02/06/2013, 11:07 AM Zenovia Jarred, PT, DPT 02/06/2013 Pager: [phone]

## 2013-02-06 NOTE — Progress Notes (Signed)
Subjective: 3 Days Post-Op Procedure(s) (LRB): INTRAMEDULLARY (IM) NAIL FEMORAL (Right) Patient reports pain as mild.    Objective: Vital signs in last 24 hours: Temp:  [97.9 F (36.6 C)-99.9 F (37.7 C)] 97.9 F (36.6 C) (03/03 0538) Pulse Rate:  [63-74] 63 (03/03 0538) Resp:  [19-20] 20 (03/03 0538) BP: (128-142)/(43-55) 128/43 mmHg (03/03 0538) SpO2:  [92 %-96 %] 92 % (03/03 0538) Weight:  [81.2 kg (179 lb 0.2 oz)] 81.2 kg (179 lb 0.2 oz) (03/03 0538)  Intake/Output from previous day: 03/02 0701 - 03/03 0700 In: 360 [P.O.:360] Out: 500 [Urine:500] Intake/Output this shift:     Recent Labs  02/03/13 1810 02/04/13 0540 02/05/13 0442 02/06/13 0425  HGB 11.1* 10.2* 10.0* 10.1*    Recent Labs  02/05/13 0442 02/06/13 0425  WBC 6.0 6.9  RBC 3.09* 3.08*  HCT 29.5* 29.2*  PLT 127* 123*    Recent Labs  02/04/13 0540 02/05/13 0442  NA 143 141  K 3.9 4.2  CL 112 110  CO2 28 25  BUN 13 11  CREATININE 0.55 0.57  GLUCOSE 209* 185*  CALCIUM 7.8* 7.9*   No results found for this basename: LABPT, INR,  in the last 72 hours  Neurologically intact Neurovascular intact Sensation intact distally Intact pulses distally Dorsiflexion/Plantar flexion intact Incision: dressing C/D/I No cellulitis present Compartment soft  Assessment/Plan: 3 Days Post-Op Procedure(s) (LRB): INTRAMEDULLARY (IM) NAIL FEMORAL (Right) Advance diet Up with therapy Discharge to SNF when bed available and ok with admitting service Partial weight bearing RLE Lovenox for DVT ppx, now 40mg  subQ daily per pharmacist recommendation  Follow up with Dr. Shelle Iron in 2 weeks for staple removal  BISSELL, JACLYN M. 02/06/2013, 7:41 AM

## 2013-02-06 NOTE — Progress Notes (Signed)
   Stable.  Not on telemetry.  Awaiting SNF.  Was on atorvastatin 10 mg daily, will add back. Restart ASA 81 unless surgery has objection. Followup Dr. Diona Browner.  Call with any further questions.   Marca Ancona 02/06/2013 8:12 AM

## 2013-02-06 NOTE — Care Management Note (Signed)
    Page 1 of 1   02/07/2013     2:33:13 PM   CARE MANAGEMENT NOTE 02/07/2013  Patient:  Anthony Page, Anthony Page   Account Number:  [id number]  Date Initiated:  02/03/2013  Documentation initiated by:  [facility]  Subjective/Objective Assessment:   Admitted w/ r hip fx.Hx:cad,cirrhosis,dm,afib.     Action/Plan:   lives alone, son - Anthony Page  had [facility] in the past, has RW at home   Anticipated DC Date:  02/07/2013   Anticipated DC Plan:  SKILLED NURSING FACILITY      DC Planning Services  CM consult      Choice offered to / List presented to:             Status of service:  Completed, signed off Medicare Important Message given?   (If response is "NO", the following Medicare IM given date fields will be blank) Date Medicare IM given:   Date Additional Medicare IM given:    Discharge Disposition:  SKILLED NURSING FACILITY  Per UR Regulation:  Reviewed for med. necessity/level of care/duration of stay  If discussed at Long Length of Stay Meetings, dates discussed:    Comments:  02/07/13 [facility] RN,BSN NCM [phone] D/C SNF.  02/06/13 Anthony Messier Mahabir RN,BSN NCM [phone] POD# 3 im nail r hip.confusion improving-lactulose. PT/OT-snf.

## 2013-02-06 NOTE — Progress Notes (Signed)
[facility] PROGRESS NOTE  Anthony Page ZOX:[id number] DOB: 1941-08-08 DOA: 02/02/2013 PCP: Josue Hector, MD  Assessment/Plan  Right intertrochanteric hip fracture s/p intramedullary nailing 2/28.   -  POD 3 -  Pain control:  Patient less sedated this morning on oral pain medications -  PT/OT recommending SNF (SW has seen and met with family)  CAD s/p CABG and grade 1 diastolic heart failure.  Denies chest pain.  BP low normal.   -  aspirin restarted -  Continue beta blocker  -  Titrate ACEI as tolerated -  Patient is not on statin - defer to primary cardiologist -  Continue lasix   PAF, stable in sinus rhythm with normal rate x 2 days -  On BB, holding ASA per cardiology  Hepatic encephalopathy.  Somnolence and confusion improving, but still not oriented and with persistent asterixis on exam.   -  ABG wnl and ammonia level 90 (high) -  Narcan prn -  Continue lactulose to BID, goal of 5-7 BMs today -  Repeat ammonia level in AM -  Monitor closely for dehydration given lasix and frequent stools, but in setting of cirrhosis and heart failure, do not want to discontinue lasix unless absolutely necessary.    Cirrhosis, unclear etiology:  No abdominal pain or distension.  + hepatic encephalopathy -  Monitor for SBP and GIB, but mental status changes likely due to recent surgery and narcotics as above -  Continue lasix  T2DM:  FS 150s-160s  -  Continue mod SSI with HS  -  Continue aspart 3 units AC -   Continue low dose lantus 5 units  Normocytic anemia, likely due to blood loss during surgery and chronic disease, hgb stable Thrombocytopenia, likely due to cirrhosis, higher than previous baseline.  No evidence of bleeding.    Diet:  Diabetic diet Access:  PIV IVF:  KVO Proph:  lovenox  Code Status: full code Family Communication: pending  Disposition Plan:  To SNF possibly tomorrow if mentation continues to improve.     Consultants:  Cardiology:  Dr.  Mayford Knife  Orthopedics:  Dr. Shelle Iron  Procedures:  Intramedullary nailing of right hip 2/28  Antibiotics:  Perioperative clindamycin  HPI/Subjective:  Patient awake this morning.  Stable ability to follow ommands and answer questions.  Denies pain, chest pain, shortness of breath, nausea, vomiting.  Denies diarrhea, constipation.      Objective: Filed Vitals:   02/06/13 0400 02/06/13 0538 02/06/13 0800 02/06/13 1018  BP:  128/43  120/42  Pulse:  63    Temp:  97.9 F (36.6 C)    TempSrc:  Axillary    Resp: 19 20 18    Height:      Weight:  81.2 kg (179 lb 0.2 oz)    SpO2: 93% 92% 93%     Intake/Output Summary (Last 24 hours) at 02/06/13 1100 Last data filed at 02/06/13 0900  Gross per 24 hour  Intake    600 ml  Output    400 ml  Net    200 ml   Filed Weights   02/03/13 0520 02/05/13 0726 02/06/13 0538  Weight: 70.024 kg (154 lb 6 oz) 80.5 kg (177 lb 7.5 oz) 81.2 kg (179 lb 0.2 oz)    Exam:   General:  Average weight CM, No acute distress  HEENT:  NCAT, MMM  Cardiovascular:   RRR, nl S1, S2 no mrg, 2+ pulses, warm extremities  Respiratory:  CTAB, no increased WOB  Abdomen:  NABS, soft, NT/ND  MSK:   Normal tone and bulk, no LEE.  Wiggles toes on right foot.  Right leg DP palpable.  Extremity warm and dry.  Dressing with dried blood, stable.  TTP along right lateral femur/greater trochanter.    Neuro:  persistent confusion, but faster following commands today.  Alert, oriented to person, but not place or time.   Grossly moves all extremities.     Data Reviewed: Basic Metabolic Panel:  Recent Labs Lab 02/02/13 1618 02/02/13 2132 02/03/13 0530 02/03/13 1810 02/04/13 0540 02/05/13 0442  NA 143  --  145  --  143 141  K 3.7  --  3.4*  --  3.9 4.2  CL 106  --  113*  --  112 110  CO2 25  --  26  --  28 25  GLUCOSE 238*  --  160*  --  209* 185*  BUN 14  --  15  --  13 11  CREATININE 0.57 0.63 0.51 0.62 0.55 0.57  CALCIUM 9.5  --  8.6  --  7.8* 7.9*  MG   --  1.9  --   --   --   --   PHOS  --  1.6*  --   --   --   --    Liver Function Tests:  Recent Labs Lab 02/02/13 2132 02/03/13 0530  AST 20 14  ALT 10 9  ALKPHOS 96 74  BILITOT 1.5* 1.4*  PROT 6.2 5.5*  ALBUMIN 2.7* 2.6*   No results found for this basename: LIPASE, AMYLASE,  in the last 168 hours  Recent Labs Lab 02/04/13 1009  AMMONIA 90*   CBC:  Recent Labs Lab 02/02/13 1618  02/03/13 0530 02/03/13 1810 02/04/13 0540 02/05/13 0442 02/06/13 0425  WBC 8.6  < > 7.1 8.0 6.2 6.0 6.9  NEUTROABS 6.6  --   --   --   --   --   --   HGB 13.1  < > 11.2* 11.1* 10.2* 10.0* 10.1*  HCT 37.6*  < > 32.3* 33.9* 30.8* 29.5* 29.2*  MCV 94.5  < > 93.9 98.0 96.6 95.5 94.8  PLT 134*  < > 122* 123* 114* 127* 123*  < > = values in this interval not displayed. Cardiac Enzymes: No results found for this basename: CKTOTAL, CKMB, CKMBINDEX, TROPONINI,  in the last 168 hours BNP (last 3 results) No results found for this basename: PROBNP,  in the last 8760 hours CBG:  Recent Labs Lab 02/05/13 0743 02/05/13 1203 02/05/13 1623 02/05/13 2224 02/06/13 0751  GLUCAP 167* 209* 196* 161* 154*    Recent Results (from the past 240 hour(s))  SURGICAL PCR SCREEN     Status: None   Collection Time    02/03/13  8:11 AM      Result Value Range Status   MRSA, PCR NEGATIVE  NEGATIVE Final   Staphylococcus aureus NEGATIVE  NEGATIVE Final   Comment:            The Xpert SA Assay (FDA     approved for NASAL specimens     in patients over 16 years of age),     is one component of     a comprehensive surveillance     program.  Test performance has     been validated by [employer] for patients greater     than or equal to 73 year old.     It is  not intended     to diagnose infection nor to     guide or monitor treatment.     Studies: No results found.  Scheduled Meds: . aspirin EC  81 mg Oral Daily  . atorvastatin  10 mg Oral q1800  . enoxaparin (LOVENOX) injection  40 mg  Subcutaneous Q24H  . escitalopram  10 mg Oral Daily  . feeding supplement  237 mL Oral BID BM  . folic acid  1 mg Oral Daily  . furosemide  40 mg Oral Daily  . insulin aspart  0-15 Units Subcutaneous TID WC  . insulin aspart  0-5 Units Subcutaneous QHS  . insulin aspart  3 Units Subcutaneous TID WC  . insulin glargine  5 Units Subcutaneous QHS  . lactulose  30 g Oral BID  . lisinopril  2.5 mg Oral Daily  . metoprolol tartrate  25 mg Oral BID  . pantoprazole  40 mg Oral Daily  . potassium chloride SA  20 mEq Oral Daily   Continuous Infusions: . dextrose 5 % with KCl 20 mEq / L 20 mEq (02/04/13 0930)    Principal Problem:   Intertrochanteric fracture of right hip Active Problems:   Type 2 diabetes mellitus   Essential hypertension, benign   CORONARY ATHEROSCLEROSIS NATIVE CORONARY ARTERY   CIRRHOSIS   Fracture, intertrochanteric, right femur   PAF (paroxysmal atrial fibrillation)   Encephalopathy, hepatic    Time spent: 30 min    Anthony Page  [facility] Pager [phone]. If 7PM-7AM, please contact night-coverage at [email or web address], password [facility] 02/06/2013, 11:00 AM  LOS: 4 days

## 2013-02-06 NOTE — Progress Notes (Signed)
   CARE MANAGEMENT NOTE 02/06/2013  Patient:  Anthony Page, Anthony Page   Account Number:  [id number]  Date Initiated:  02/03/2013  Documentation initiated by:  [facility]  Subjective/Objective Assessment:     Action/Plan:   lives alone, son - Rachael Zapanta  had [facility] in the past, has RW at home   Anticipated DC Date:     Anticipated DC Plan:  HOME W HOME HEALTH SERVICES      DC Planning Services  CM consult      Choice offered to / List presented to:             Status of service:  In process, will continue to follow Medicare Important Message given?   (If response is "NO", the following Medicare IM given date fields will be blank) Date Medicare IM given:   Date Additional Medicare IM given:    Discharge Disposition:    Per UR Regulation:    If discussed at Long Length of Stay Meetings, dates discussed:    Comments:

## 2013-02-07 LAB — GLUCOSE, CAPILLARY: Glucose-Capillary: 196 mg/dL — ABNORMAL HIGH (ref 70–99)

## 2013-02-07 LAB — BASIC METABOLIC PANEL
BUN: 13 mg/dL (ref 6–23)
Calcium: 7.9 mg/dL — ABNORMAL LOW (ref 8.4–10.5)
Creatinine, Ser: 0.58 mg/dL (ref 0.50–1.35)
GFR calc non Af Amer: >90 mL/min (ref >90)
Glucose, Bld: 233 mg/dL — ABNORMAL HIGH (ref 70–99)

## 2013-02-07 MED ORDER — ENSURE COMPLETE PO LIQD: 237.0000 mL | Freq: Two times a day (BID) | ORAL | Status: DC

## 2013-02-07 MED ORDER — INSULIN GLARGINE 100 UNIT/ML ~~LOC~~ SOLN: 10.0000 [IU] | Freq: Every day | SUBCUTANEOUS | Status: DC

## 2013-02-07 MED ORDER — LACTULOSE 10 GM/15ML PO SOLN: 20.0000 g | Freq: Two times a day (BID) | ORAL | Status: AC

## 2013-02-07 MED ORDER — ACETAMINOPHEN 325 MG PO TABS: 650.0000 mg | ORAL_TABLET | Freq: Four times a day (QID) | ORAL | Status: AC | PRN

## 2013-02-07 MED ORDER — LACTULOSE 10 GM/15ML PO SOLN
10.0000 g | Freq: Two times a day (BID) | ORAL | Status: DC
  Filled 2013-02-07 (×2): qty 15

## 2013-02-07 MED ORDER — LISINOPRIL 5 MG PO TABS: 5.0000 mg | ORAL_TABLET | Freq: Every day | ORAL | Status: DC

## 2013-02-07 MED ORDER — BENZONATATE 100 MG PO CAPS: 100.0000 mg | ORAL_CAPSULE | Freq: Three times a day (TID) | ORAL | Status: DC | PRN

## 2013-02-07 MED ORDER — INSULIN ASPART 100 UNIT/ML ~~LOC~~ SOLN
4.0000 [IU] | Freq: Three times a day (TID) | SUBCUTANEOUS | Status: DC
  Administered 2013-02-07 (×2): 4 [IU] via SUBCUTANEOUS

## 2013-02-07 MED ORDER — ATORVASTATIN CALCIUM 10 MG PO TABS: 10.0000 mg | ORAL_TABLET | Freq: Every day | ORAL | Status: DC

## 2013-02-07 MED ORDER — POTASSIUM CHLORIDE CRYS ER 20 MEQ PO TBCR
40.0000 meq | EXTENDED_RELEASE_TABLET | Freq: Every day | ORAL | Status: DC
  Administered 2013-02-07: 40 meq via ORAL
  Filled 2013-02-07: qty 2

## 2013-02-07 NOTE — Progress Notes (Signed)
Pt c/o of congested cough with thick, white sputum. Lung sounds diminished. VSS. Donnamarie Poag, NP notified. Orders received. Will continue to monitor. Newman Nip Kildeer

## 2013-02-07 NOTE — Progress Notes (Signed)
Report called to Dustin Flock, RN at [facility]. Ambulance here to take pt to facility now.

## 2013-02-07 NOTE — Progress Notes (Deleted)
Renae Fickle, MD Physician Signed Internal Medicine Discharge Summaries Service date: 02/07/2013 11:54 AM  Physician Discharge Summary    Anthony Page:[id number] DOB: 1940-12-17 DOA: 02/02/2013   PCP: Josue Hector, MD   Admit date: 02/02/2013 Discharge date: 02/07/2013   Recommendations for Outpatient Follow-up:   To SNF for ongoing PT/OT.  Recommending patient use rolling walker with 5" wheels and wheelchair, 3-in-1 bedside commode and tub/shower bench.    Partial weight bearing right lower extremity Follow up Dr. Shelle Iron for staple removal.   4.  May replace dressing PRN, keep incision clean and dry.  No submersion.     Discharge Diagnoses:  Principal Problem:   Intertrochanteric fracture of right hip Active Problems:   Type 2 diabetes mellitus   Essential hypertension, benign   CORONARY ATHEROSCLEROSIS NATIVE CORONARY ARTERY   CIRRHOSIS   Fracture, intertrochanteric, right femur   PAF (paroxysmal atrial fibrillation)   Encephalopathy, hepatic   Discharge Condition: stable, improved   Diet recommendation: diabetic, 2gm sodium    Wt Readings from Last 3 Encounters:   02/07/13  80.9 kg (178 lb 5.6 oz)   02/07/13  80.9 kg (178 lb 5.6 oz)   12/19/12  77.111 kg (170 lb)      History of present illness:     Anthony Page is a 72 y.o. male  With past medical history of cirrhosis and status post portal couple shunt, CAD status post CABG, thrombocytopenia, delirium post reparative, paroxysmal A. fib currently not on Coumadin secondary to his elevated PT and INR and liver disease, diastolic heart failure with an echo in 2011 revealed LVEF 55-60%, grade 1 diastolic dysfunction. Comes in for a followup earlier this morning landing on his right side. From cleaning of right knee and hip pain. Is also complaining of rib pain. In the emergency room x-ray was done that showedAcute intertrochanteric fracture of the right hip. The x-ray does not show any evidence of fracture  and rib x-ray.  Nondisplaced right fourth - seventh rib fractures, age indeterminate.     Hospital Course:    Right intertrochanteric hip fracture s/p intramedullary nailing 2/28.  Doing well postoperatively.  Tolerating oral pain medication and has worked with PT/OT who are recommending skilled nursing for ongoing rehabilitation.  He should continue daily aspirin and lovenox 40mg  subcutaneous daily until he follows up with orthopedics in 2 weeks.     CAD s/p CABG and grade 1 diastolic heart failure.  He was seen by cardiology for preoperative assessment.  His aspirin was held perioperatively, but restarted, and his beta blocker was continued.  He was started on atorvastatin and restarted on a slightly lower dose of ACEI.  Dose may need to be increased if blood pressure tolerates or requires, which can be done at his follow up appointment with his PCP.  His lasix was continued.     PAF previously in the post-operative setting, stable in sinus rhythm with normal rate for 48 hours postoperatively.  Not anticoagulated due to isolated episode of a-fib with known reversible cause previously.  He was continued on beta blocker.  He is not on warfarin.  Aspirin restarted.     Hepatic encephalopathy:  Somnolence and confusion developed postoperatively, likely due to hepatic encephalopathy and narcotic use.  ABG was negative for hypercapnea.  His IV pain medications were discontinued.  His ammonia level was 90 so his lactulose dose was increased so that he had 4-7 BMs per day for the subsequent two days.  His  repeat ammonia level was 50 and his asterixis and orientation improved.     Cirrhosis, unclear etiology: No signs of SBP or GIB.  Continued lasix   Cough.  While sleeping, patient appears to have snoring without pauses.  Consider outpatient sleep study.  Has some upper airway congestion also. No fevers.  CXR negative for pneumonia.  No hypoxia or tachypnea.     T2DM: FS stable to mildly elevated  post operatively.  He may resume his metformin as outpatient   Normocytic anemia, likely due to blood loss during surgery and chronic disease.  Follow up with PCP for further evaluation of anemia if not already complete.     Thrombocytopenia, likely due to cirrhosis, higher than previous baseline. No evidence of bleeding.      Consultants:  Cardiology: Dr. Mayford Knife   Orthopedics: Dr. Shelle Iron Procedures:  Intramedullary nailing of right hip 2/28 Antibiotics:  None     Discharge Exam: Filed Vitals:     02/07/13 0800   BP:     Pulse:     Temp:     Resp:  20    Filed Vitals:     02/07/13 0149  02/07/13 0400  02/07/13 0457  02/07/13 0800   BP:      134/53     Pulse:  72    71     Temp:      98.6 F (37 C)     TempSrc:      Oral     Resp:    18  18  20    Height:           Weight:      80.9 kg (178 lb 5.6 oz)     SpO2:  94%  100%  100%  95%      Patient states thinking is clearer.  Denies CP, SOB, N/V.  Has been having frequent bowel movements after starting increased lactulose.  Denies dysuria or problems urinating.      General: Average weight CM, No acute distress, lying in bed. HEENT: NCAT, MMM   Cardiovascular: RRR, nl S1, S2 no mrg, 2+ pulses, warm extremities   Respiratory: CTAB, no increased WOB   Abdomen: NABS, soft, NT/ND   MSK: Normal tone and bulk, no LEE.  Dressing with 10cm dried blood streak superior to a second area 1/2 dollar sized, stable for last two days.  Less TTP along right lateral femur/greater trochanter.  Right leg DP palpable. Extremity warm and dry. Neuro: Sleepy but easily aroused.  Answers questions appropriately and follows commands.      Discharge Instructions          Discharge Orders     Future Orders  Complete By  Expires        Call MD for:  difficulty breathing, headache or visual disturbances   As directed         Call MD for:  extreme fatigue   As directed         Call MD for:  hives   As directed         Call MD for:   persistant dizziness or light-headedness   As directed         Call MD for:  persistant nausea and vomiting   As directed         Call MD for:  redness, tenderness, or signs of infection (pain, swelling, redness, odor or green/yellow discharge around incision site)   As directed  Call MD for:  severe uncontrolled pain   As directed         Call MD for:  temperature >100.4   As directed         Diet Carb Modified   As directed         Comments:         2 gm sodium       Discharge instructions   As directed         Comments:         You were hospitalized with a right hip fracture and had intramedullary nailing on 2/28.  You developed some confusion related to your cirrhosis which is caused by accumulation of ammonia in the blood stream.  Your confusion improved with increased lactulose which causes diarrhea and brings down ammonia levels.  You will need to continue increased lactulose to help prevent confusion in the future.  You were started on a lower dose of lisinopril because your blood pressures have been normal on a lower dose.  You were started on atorvastatin for your heart disease.       Increase activity slowly   As directed         Partial weight bearing   As directed         Scheduling Instructions:         Right leg            Medication List      TAKE these medications           acetaminophen 325 MG tablet   Commonly known as:  TYLENOL   Take 2 tablets (650 mg total) by mouth every 6 (six) hours as needed for pain.         aspirin EC 81 MG tablet   Take 81 mg by mouth daily.         atorvastatin 10 MG tablet   Commonly known as:  LIPITOR   Take 1 tablet (10 mg total) by mouth daily at 6 PM.         benzonatate 100 MG capsule   Commonly known as:  TESSALON   Take 1 capsule (100 mg total) by mouth 3 (three) times daily as needed for cough.         enoxaparin 30 MG/0.3ML injection   Commonly known as:  LOVENOX   Inject 0.4 mLs (40 mg total) into the skin  daily.         escitalopram 10 MG tablet   Commonly known as:  LEXAPRO   Take 10 mg by mouth Daily.         feeding supplement Liqd   Take 237 mLs by mouth 2 (two) times daily between meals.         folic acid 1 MG tablet   Commonly known as:  FOLVITE   Take 1 mg by mouth daily.         furosemide 40 MG tablet   Commonly known as:  LASIX   Take 40 mg by mouth daily.         glipiZIDE 5 MG 24 hr tablet   Commonly known as:  GLUCOTROL XL   Take 5 mg by mouth daily.         HYDROcodone-acetaminophen 5-325 MG per tablet   Commonly known as:  NORCO   Take 1-2 tablets by mouth every 6 (six) hours as needed for pain.         lactulose 10  GM/15ML solution   Commonly known as:  CHRONULAC   Take 30 mLs (20 g total) by mouth 2 (two) times daily.         lisinopril 5 MG tablet   Commonly known as:  PRINIVIL,ZESTRIL   Take 1 tablet (5 mg total) by mouth daily.         metFORMIN 500 MG 24 hr tablet   Commonly known as:  GLUCOPHAGE-XR   Take 1,000 mg by mouth Twice daily.         metoprolol tartrate 25 MG tablet   Commonly known as:  LOPRESSOR   Take 25 mg by mouth 2 (two) times daily.         omeprazole 20 MG capsule   Commonly known as:  PRILOSEC   Take 20 mg by mouth daily.         potassium chloride SA 20 MEQ tablet   Commonly known as:  K-DUR,KLOR-CON   Take 20 mEq by mouth daily.           Follow-up Information     Follow up with BEANE,JEFFREY C, MD In 2 weeks.     Contact information:     [address] [place] [address] [phone]           Schedule an appointment as soon as possible for a visit with Josue Hector, MD. (As needed)      Contact information:     [address] [facility] [address] [phone]           The results of significant diagnostics from this hospitalization (including imaging, microbiology, ancillary and laboratory) are listed below for reference.       Significant Diagnostic Studies: Dg  Ribs Unilateral W/chest Right   02/02/2013  *RADIOLOGY REPORT*  Clinical Data: Fall with chest pain.  RIGHT RIBS AND CHEST - 3+ VIEW  Comparison: 04/26/2012 and prior chest radiographs  Findings: Cardiomegaly and CABG changes noted. Bibasilar atelectasis identified with scattered areas of unchanged pulmonary scarring. There are nondisplaced fractures of the right 4th - 7th ribs and are age indeterminate. There is no evidence of pleural effusion or pneumothorax. Remote appearing left 4th-8th rib fractures are present.  IMPRESSION: Nondisplaced right fourth - seventh rib fractures, age indeterminate.  Remote appearing fractures of the left 4th - 8th ribs but correlate clinically with pain.  Bibasilar atelectasis.   Original Report Authenticated By: Harmon Pier, M.D.     Dg Hip Complete Right   02/02/2013  *RADIOLOGY REPORT*  Clinical Data: Larey Seat, pain  RIGHT HIP - COMPLETE 2+ VIEW  Comparison: None.  Findings: Intertrochanteric fracture of the right hip.  Slight comminution.  Slight widening of the proximal aspect of the fracture.  No pelvic fractures. Small fecal impaction.  IMPRESSION: Acute intertrochanteric fracture of the right hip.   Original Report Authenticated By: Davonna Belling, M.D.     Dg Hip Operative Right   02/03/2013  *RADIOLOGY REPORT*  Clinical Data: Fracture fixation.  DG OPERATIVE RIGHT HIP  Comparison: Plain films 02/02/2013.  Findings: We are provided with six intraoperative views of the right hip.  Images demonstrate placement of a dynamic hip screw with a short IM nail for fixation of an intertrochanteric fracture. No evidence of complication.  IMPRESSION: ORIF right intertrochanteric fracture.   Original Report Authenticated By: Holley Dexter, M.D.     Dg Knee 1-2 Views Right   02/02/2013  *RADIOLOGY REPORT*  Clinical Data: Fall with right knee pain.  RIGHT KNEE -  1-2 VIEW  Comparison: None  Findings: No acute fracture, subluxation or dislocation identified. No definite effusion  is identified. No focal bony lesions are present. Mild tricompartmental joint space narrowing identified.  IMPRESSION: No evidence of acute bony abnormality.   Original Report Authenticated By: Harmon Pier, M.D.     Ct Head Wo Contrast   02/02/2013  *RADIOLOGY REPORT*  Clinical Data: Larey Seat without loss of consciousness.  CT HEAD WITHOUT CONTRAST  Technique:  Contiguous axial images were obtained from the base of the skull through the vertex without contrast.  Comparison: 02/24/2005.  Findings: There is no evidence for acute infarction, intracranial hemorrhage, mass lesion, hydrocephalus, or extra-axial fluid.  Mild atrophy.  Mild chronic microvascular ischemic change.  Calvarium intact.  Advanced vascular calcification.  There is a hyperdense retention cyst in the central compartment of the sphenoid sinus measuring 21 x 24 mm cross-section.  Variable areas of increased attenuation could represent inspissated mucus, fungal infection, or even calcification. This retention cyst does not fill the sinus at this time and there are no features to suggest a mucocele.  Mild chronic sinus disease can be seen in both maxillary sinuses, and the posterior right ethmoids.  Compared with priors, atrophy has progressed.  IMPRESSION: Mild atrophy and chronic microvascular ischemic change.  There is no skull fracture or intracranial hemorrhage.  Chronic sinusitis as described.   Original Report Authenticated By: Davonna Belling, M.D.     Dg Pelvis Portable   02/03/2013  *RADIOLOGY REPORT*  Clinical Data: 72 year old male status post right hip surgery.  PORTABLE PELVIS  Comparison: 1520 hours the same day and earlier.  Findings: Portable AP view of the pelvis 1820 hours.  New proximal right femur intramedullary rod with proximal interlocking dynamic hip screw and distal interlocking cortical screw.  Near anatomic alignment at the right intertrochanteric fracture.  Overlying postoperative soft tissue change is including skin  staples.  Right femoral head normally located.  Pelvis appears intact.  IMPRESSION: ORIF right proximal femur with no adverse features.   Original Report Authenticated By: Erskine Speed, M.D.       Microbiology: Recent Results (from the past 240 hour(s))   SURGICAL PCR SCREEN     Status: None     Collection Time      02/03/13  8:11 AM       Result  Value  Range  Status     MRSA, PCR  NEGATIVE   NEGATIVE  Final     Staphylococcus aureus  NEGATIVE   NEGATIVE  Final     Comment:                The Xpert SA Assay (FDA        approved for NASAL specimens        in patients over 50 years of age),        is one component of        a comprehensive surveillance        program.  Test performance has        been validated by [employer] for patients greater        than or equal to 70 year old.        It is not intended        to diagnose infection nor to        guide or monitor treatment.      Labs: Basic Metabolic Panel: Recent Labs Lab  02/02/13 1618  02/02/13 2132  02/03/13 0530  02/03/13 1810  02/04/13 0540  02/05/13 0442  02/07/13 0510   NA  143   --   145   --   143  141  142   K  3.7   --   3.4*   --   3.9  4.2  3.0*   CL  106   --   113*   --   112  110  109   CO2  25   --   26   --   28  25  26    GLUCOSE  238*   --   160*   --   209*  185*  233*   BUN  14   --   15   --   13  11  13    CREATININE  0.57  0.63  0.51  0.62  0.55  0.57  0.58   CALCIUM  9.5   --   8.6   --   7.8*  7.9*  7.9*   MG   --   1.9   --    --    --    --    --    PHOS   --   1.6*   --    --    --    --    --     Liver Function Tests: Recent Labs Lab  02/02/13 2132  02/03/13 0530   AST  20  14   ALT  10  9   ALKPHOS  96  74   BILITOT  1.5*  1.4*   PROT  6.2  5.5*   ALBUMIN  2.7*  2.6*    No results found for this basename: LIPASE, AMYLASE,  in the last 168 hours Recent Labs Lab  02/04/13 1009  02/07/13 0510   AMMONIA  90*  49    CBC: Recent Labs Lab  02/02/13 1618     02/03/13 0530  02/03/13 1810  02/04/13 0540  02/05/13 0442  02/06/13 0425   WBC  8.6   < >  7.1  8.0  6.2  6.0  6.9   NEUTROABS  6.6   --    --    --    --    --    --    HGB  13.1   < >  11.2*  11.1*  10.2*  10.0*  10.1*   HCT  37.6*   < >  32.3*  33.9*  30.8*  29.5*  29.2*   MCV  94.5   < >  93.9  98.0  96.6  95.5  94.8   PLT  134*   < >  122*  123*  114*  127*  123*    < > = values in this interval not displayed. Cardiac Enzymes: No results found for this basename: CKTOTAL, CKMB, CKMBINDEX, TROPONINI,  in the last 168 hours BNP: BNP (last 3 results) No results found for this basename: PROBNP,  in the last 8760 hours CBG: Recent Labs Lab  02/06/13 1231  02/06/13 1641  02/06/13 2211  02/07/13 0754  02/07/13 1147   GLUCAP  253*  214*  258*  196*  262*      Time coordinating discharge:  45  minutes   Signed:   SHORT, MACKENZIE  [facility] 02/07/2013, 12:13 PM

## 2013-02-07 NOTE — Progress Notes (Signed)
Clinical Social Work Department CLINICAL SOCIAL WORK PLACEMENT NOTE 02/07/2013  Patient:  Anthony Page, Anthony Page  Account Number:  [id number] Admit date:  02/02/2013  Clinical Social Worker:  Doroteo Glassman  Date/time:  02/05/2013 10:01 AM  Clinical Social Work is seeking post-discharge placement for this patient at the following level of care:   SKILLED NURSING   (*CSW will update this form in Epic as items are completed)   02/07/2013  Patient/family provided with [facility] Department of Clinical Social Work's list of facilities offering this level of care within the geographic area requested by the patient (or if unable, by the patient's family).  02/05/2013  Patient/family informed of their freedom to choose among providers that offer the needed level of care, that participate in Medicare, Medicaid or managed care program needed by the patient, have an available bed and are willing to accept the patient.  02/05/2013  Patient/family informed of [facility]' ownership interest in [facility], as well as of the fact that they are under no obligation to receive care at this facility.  PASARR submitted to EDS on 02/07/2013 PASARR number received from EDS on 02/07/2013  FL2 transmitted to all facilities in geographic area requested by pt/family on  02/05/2013 FL2 transmitted to all facilities within larger geographic area on   Patient informed that his/her managed care company has contracts with or will negotiate with  certain facilities, including the following:     Patient/family informed of bed offers received:  02/07/2013 Patient chooses bed at [facility] Physician recommends and patient chooses bed at    Patient to be transferred to [facility] on  02/07/2013 Patient to be transferred to facility by [facility]tar  The following physician request were entered in Epic:   Additional Comments:

## 2013-02-07 NOTE — Progress Notes (Addendum)
CSW spoke with patient's family. Patient is cleared for discharge. They are agreeable to [facility].  Bethany C. Corcoran MSW, LCSW [phone] Packet copied and placed in [place]. Patient's son informed of discharge. He has called [facility] to set up paperwork.  Bethany C. Corcoran MSW, LCSW [phone]

## 2013-03-29 ENCOUNTER — Non-Acute Institutional Stay (SKILLED_NURSING_FACILITY): Payer: Medicare Other | Admitting: Internal Medicine

## 2013-03-29 DIAGNOSIS — G219 Secondary parkinsonism, unspecified: Secondary | ICD-10-CM

## 2013-03-29 DIAGNOSIS — K7460 Unspecified cirrhosis of liver: Secondary | ICD-10-CM

## 2013-04-04 NOTE — Progress Notes (Signed)
Patient ID: Anthony Page, male   DOB: [birth year], 72 y.o.   MRN: [id number]          PROGRESS NOTE  DATE:  03/29/2013  FACILITY: [facility]   LEVEL OF CARE:   SNF   Acute Visit   CHIEF COMPLAINT:   Review of general medical issues.    HISTORY OF PRESENT ILLNESS:  Anthony Page is a 72 year-old man who fell in his own home, suffering a right hip fracture.  He underwent an ORIF and did well postoperatively.  He came here for rehabilitation.  I admitted him to the building on February 08, 2013.    He has an impressive list of comorbidities including coronary artery disease, status post CABG, history of alcoholic cirrhosis, diastolically-mediated heart failure, paroxysmal atrial fibrillation, not on Coumadin due to liver disease.    He is also a type 2 diabetic, on Glucotrol XL and metformin.  His CBGs are well controlled in the facility.    He  apparently has done well in rehab.  He walks with a walker 200 feet.  He  apparently lives by himself.  He has family assistance.  I have spoken to the Child psychotherapist.  At this point, his discharge plans are still tentative but he may not be able to go home on his own.    REVIEW OF SYSTEMS:   CHEST/RESPIRATORY:  No shortness of breath.  CARDIAC:   No chest pain.   GI:  No nausea or vomiting.  He has shown no signs of encephalopathy.  PHYSICAL EXAMINATION:   GENERAL APPEARANCE:  The patient is alert, somewhat hard of hearing.   CHEST/RESPIRATORY:  Shallow, but otherwise clear air entry.   CARDIOVASCULAR:   CARDIAC:   The aforementioned CABG scar noted.  Heart sounds are irregular.  There are no murmurs.  No increase in jugular venous pressure.   GASTROINTESTINAL:  LIVER/SPLEEN/KIDNEYS:  No liver, no spleen.  He has no asterixis and no signs of encephalopathy.   ABDOMEN:  No masses are noted.    ASSESSMENT/PLAN:  Type 2 diabetes.  This is well controlled.  I will drop his CBGs down to b.i.d. Once a week.    Once again, I note he has mild  idiopathic Parkinson's disease.  I do not think any medications are necessary at this point, although this will need to be followed.    Hepatic cirrhosis with portal hypertension.  I really do not see a particular issue here.  He is on lactulose 30 b.i.d.    His vitamin D level was 26.1.  I would probably want it higher than that.   Hemoglobin A1C is 6.9.  His LDL cholesterol is 26, HDL cholesterol 27.     CPT CODE: 28413

## 2013-06-12 ENCOUNTER — Other Ambulatory Visit: Payer: Self-pay | Admitting: Cardiovascular Disease

## 2013-06-14 ENCOUNTER — Telehealth: Payer: Self-pay | Admitting: Cardiology

## 2013-06-14 MED ORDER — METOPROLOL TARTRATE 25 MG PO TABS: 25.0000 mg | ORAL_TABLET | Freq: Two times a day (BID) | ORAL | Status: DC

## 2013-06-14 MED ORDER — ATORVASTATIN CALCIUM 10 MG PO TABS: 10.0000 mg | ORAL_TABLET | Freq: Every day | ORAL | Status: DC

## 2013-06-14 MED ORDER — POTASSIUM CHLORIDE CRYS ER 20 MEQ PO TBCR: 20.0000 meq | EXTENDED_RELEASE_TABLET | Freq: Every day | ORAL | Status: DC

## 2013-06-14 MED ORDER — FUROSEMIDE 40 MG PO TABS: 40.0000 mg | ORAL_TABLET | Freq: Every day | ORAL | Status: AC

## 2013-06-14 MED ORDER — LISINOPRIL 5 MG PO TABS: 5.0000 mg | ORAL_TABLET | Freq: Every day | ORAL | Status: DC

## 2013-06-14 NOTE — Telephone Encounter (Signed)
[facility] IN [place]  Patient said that he is out of all medication and that [facility] told him to call us for refills

## 2013-07-12 ENCOUNTER — Other Ambulatory Visit: Payer: Self-pay | Admitting: Cardiovascular Disease

## 2014-01-16 ENCOUNTER — Other Ambulatory Visit: Payer: Self-pay | Admitting: Cardiology

## 2014-02-21 ENCOUNTER — Other Ambulatory Visit: Payer: Self-pay | Admitting: Cardiology

## 2014-03-22 ENCOUNTER — Other Ambulatory Visit: Payer: Self-pay | Admitting: Cardiology

## 2014-04-23 ENCOUNTER — Other Ambulatory Visit: Payer: Self-pay | Admitting: Cardiology

## 2014-04-29 ENCOUNTER — Other Ambulatory Visit: Payer: Self-pay | Admitting: Cardiology

## 2014-06-26 ENCOUNTER — Other Ambulatory Visit: Payer: Self-pay | Admitting: Cardiology

## 2014-08-02 ENCOUNTER — Other Ambulatory Visit: Payer: Self-pay | Admitting: *Deleted

## 2014-08-02 MED ORDER — POTASSIUM CHLORIDE CRYS ER 20 MEQ PO TBCR: 20.0000 meq | EXTENDED_RELEASE_TABLET | Freq: Every day | ORAL | Status: DC

## 2014-09-24 IMAGING — CR DG CHEST 1V PORT
1 series · 1 of 1 positions shown · non-contrast
Comparison: Chest radiograph 02/02/2013

CLINICAL DATA: New cough and congestion

PORTABLE CHEST - 1 VIEW

[AP]
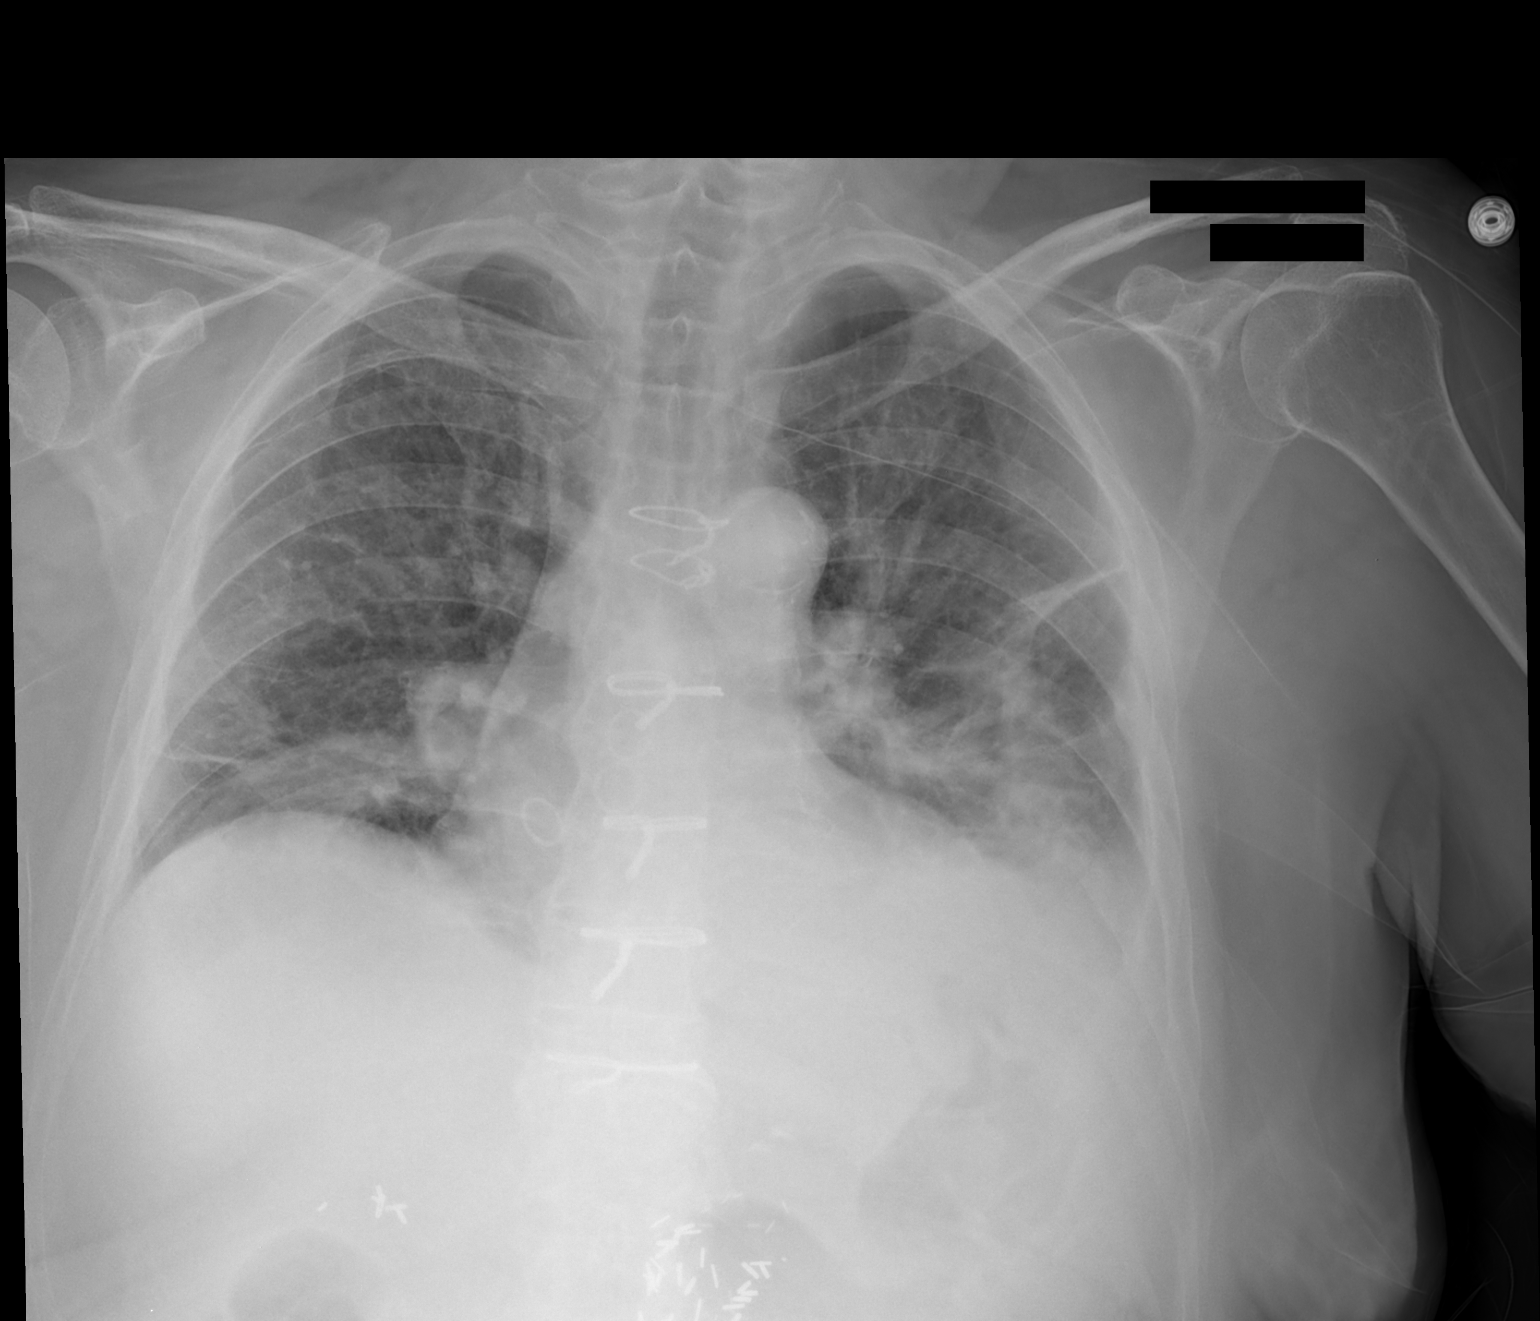

[1 of 1 positions shown; findings below may reference images not displayed]

FINDINGS: Sternotomy wires overlie normal cardiac silhouette.
There is bibasilar atelectasis and air space disease increased
compared to prior. There is a left pleural effusion which is
slightly increased.  No pneumothorax
IMPRESSION: 1.  Increasing bibasilar atelectasis and airspace disease on the
left.
2.  Increase in small left effusion.

## 2014-10-08 ENCOUNTER — Other Ambulatory Visit: Payer: Self-pay | Admitting: *Deleted

## 2014-10-08 MED ORDER — LISINOPRIL 5 MG PO TABS: 5.0000 mg | ORAL_TABLET | Freq: Every day | ORAL | Status: DC

## 2015-05-14 ENCOUNTER — Telehealth: Payer: Self-pay | Admitting: *Deleted

## 2015-05-14 NOTE — Telephone Encounter (Signed)
Left message for patient to contact office regarding rx request we received for atorvastatin. Patient needs an office visit.

## 2017-08-02 ENCOUNTER — Encounter: Payer: Self-pay | Admitting: Cardiology

## 2017-09-09 ENCOUNTER — Encounter: Payer: Self-pay | Admitting: *Deleted

## 2017-09-10 ENCOUNTER — Telehealth: Payer: Self-pay | Admitting: Cardiology

## 2017-09-10 ENCOUNTER — Ambulatory Visit (INDEPENDENT_AMBULATORY_CARE_PROVIDER_SITE_OTHER): Payer: Medicare Other | Admitting: Cardiology

## 2017-09-10 ENCOUNTER — Encounter: Payer: Self-pay | Admitting: Cardiology

## 2017-09-10 VITALS — BP 116/58 | HR 75 | Ht 67.0 in | Wt 152.4 lb

## 2017-09-10 DIAGNOSIS — I25119 Atherosclerotic heart disease of native coronary artery with unspecified angina pectoris: Secondary | ICD-10-CM | POA: Diagnosis not present

## 2017-09-10 DIAGNOSIS — Z8679 Personal history of other diseases of the circulatory system: Secondary | ICD-10-CM | POA: Diagnosis not present

## 2017-09-10 DIAGNOSIS — I10 Essential (primary) hypertension: Secondary | ICD-10-CM | POA: Diagnosis not present

## 2017-09-10 DIAGNOSIS — Z87898 Personal history of other specified conditions: Secondary | ICD-10-CM

## 2017-09-10 NOTE — Progress Notes (Signed)
Cardiology Office Note  Date: 09/10/2017   ID: Anthony Page, DOB 1941-01-05, MRN [id number]  PCP: Joette Catching, MD  Primary Cardiologist: Nona Dell, MD   Chief Complaint  Patient presents with  . Coronary Artery Disease  . History of syncope    History of Present Illness: Anthony Page is a 76 y.o. male referred back to the office by Dr. Lysbeth Galas for follow-up on CAD. He was last seen in 2014. Records indicate hospitalization at [facility] after reported episode of syncope and with severe back pain and leg weakness. He was found to have multiple thoracic compression fractures and required a subsequent SNF rehabilitation stay. Also found to have left large left pleural effusion which had increased in size compared to previous imaging. It is unclear whether additional cardiac imaging studies were obtained based on the information received. There is mention in discharge summary about consideration for a 30 day event monitor.  He presents today with his son. Still has home health nursing and PTT. Son states that he actually has been doing much better with the therapy. He is using a cane, has had no syncope and does not report any progressive angina symptoms on medical therapy.  I reviewed his medications. Cardiac regimen includes aspirin, Lasix, and potassium supplements. Heart rate and blood pressure are well controlled today. The present time he is not on any AV nodal blockers and is not on statin therapy.  He does not report any syncopal events since his hospital stay.  Past Medical History:  Diagnosis Date  . Back fracture    Severe fractures of the feet and back from traumatic fall w/long-term disabilites  . Cirrhosis (HCC)   . Coronary atherosclerosis of native coronary artery    Multivessel status post CABG  . DM2 (diabetes mellitus, type 2) (HCC)   . Dyslipidemia   . Essential hypertension, benign   . GERD (gastroesophageal reflux disease)   . Meniere  disease    Partial deafness  . Paroxysmal atrial fibrillation (HCC)    Postoperative    Past Surgical History:  Procedure Laterality Date  . CORONARY ARTERY BYPASS GRAFT  2011   LIMA to LAD, SVG to diagonal, SVG to OM, SVG to RCA  . FEMUR IM NAIL Right 02/03/2013   Procedure: INTRAMEDULLARY (IM) NAIL FEMORAL;  Surgeon: Javier Docker, MD;  Location: [facility]S;  Service: Orthopedics;  Laterality: Right;  RIGHT HIP INTERMEDULLARY  NAIL  . Left inguinal hernia    . Portacaval shunt      Current Outpatient Prescriptions  Medication Sig Dispense Refill  . acetaminophen (TYLENOL) 325 MG tablet Take 2 tablets (650 mg total) by mouth every 6 (six) hours as needed for pain. (Patient taking differently: Take 650 mg by mouth 2 (two) times daily as needed. )    . alendronate (FOSAMAX) 70 MG tablet Take 1 tablet by mouth once a week.  12  . aspirin EC 81 MG tablet Take 81 mg by mouth daily.    . calcium carbonate (TUMS - DOSED IN MG ELEMENTAL CALCIUM) 500 MG chewable tablet Chew 1 tablet by mouth 2 (two) times daily.    . carbidopa-levodopa (SINEMET IR) 25-100 MG tablet Take 1 tablet by mouth 3 (three) times daily.  3  . cholecalciferol (VITAMIN D) 1000 units tablet Take 1,000 Units by mouth daily.    Marland Kitchen escitalopram (LEXAPRO) 10 MG tablet Take 10 mg by mouth Daily.     . folic acid (FOLVITE)  1 MG tablet Take 1 mg by mouth daily.  2  . furosemide (LASIX) 40 MG tablet Take 1 tablet (40 mg total) by mouth daily. 30 tablet 6  . GLIPIZIDE XL 10 MG 24 hr tablet Take 1 tablet by mouth 2 (two) times daily.  3  . JANUVIA 100 MG tablet Take 100 mg by mouth daily.  3  . lactulose (CHRONULAC) 10 GM/15ML solution Take 30 mLs (20 g total) by mouth 2 (two) times daily. 240 mL   . meloxicam (MOBIC) 7.5 MG tablet Take 1 tablet by mouth 2 (two) times daily.  3  . Multiple Vitamin (MULTIVITAMIN) tablet Take 1 tablet by mouth daily.    Marland Kitchen omeprazole (PRILOSEC) 20 MG capsule Take 20 mg by mouth daily.    . potassium  chloride SA (K-DUR,KLOR-CON) 20 MEQ tablet Take 1 tablet by mouth daily.     No current facility-administered medications for this visit.    Allergies:  Codeine and Penicillins   Social History: The patient  reports that he quit smoking about 28 years ago. His smoking use included Cigarettes and Cigars. He has a 40.00 pack-year smoking history. He has never used smokeless tobacco. He reports that he does not drink alcohol or use drugs.   Family History: The patient's family history includes Cancer in his brother and sister; Diabetes in his brother; Heart disease in his brother and mother.   ROS:  Please see the history of present illness. Otherwise, complete review of systems is positive for hearing loss, arthritic stiffness.  All other systems are reviewed and negative.   Physical Exam: VS:  BP (!) 116/58 (BP Location: Right Arm, Cuff Size: Normal)   Pulse 75   Ht  (1.702 m)   Wt 152 lb 6.4 oz (69.1 kg)   SpO2 95%   BMI 23.87 kg/m , BMI Body mass index is 23.87 kg/m.  Wt Readings from Last 3 Encounters:  09/10/17 152 lb 6.4 oz (69.1 kg)  02/07/13 178 lb 5.6 oz (80.9 kg)  12/19/12 170 lb (77.1 kg)    General: Elderly male, no distress. Using a cane. HEENT: Conjunctiva and lids normal, oropharynx clear. Neck: Supple, no elevated JVP or carotid bruits, no thyromegaly. Lungs: Decreased breath sounds on left, nonlabored breathing at rest. Cardiac: Regular rate and rhythm, no S3, soft systolic murmur, no pericardial rub. Abdomen: Soft, nontender, bowel sounds present, no guarding or rebound. Extremities: No pitting edema, distal pulses 2+. Skin: Warm and dry. Musculoskeletal: No kyphosis. Neuropsychiatric: Alert and oriented x3, affect grossly appropriate.  ECG: I personally reviewed the tracing from 08/02/2017 which showed sinus rhythm with low voltage, decreased R wave progression, leftward axis.  Recent Labwork:  August 2018: Hemoglobin 12.7, creatinine 0.64 September  2018: Hemoglobin 12.9, platelets 175, BUN 10, current 0.73, potassium 4.0, AST 31, ALT 13, hemoglobin A1c 8.7, cholesterol 122, triglycerides 102, HDL 42, LDL 60  Assessment and Plan:  1. History of syncope, no definitive cardiogenic etiology has been documented over time. At this point we will obtain a 7 day event monitor to document heart rate variability. We can consider longer-term monitoring depending on symptomatology and whether he has any evidence in the short-term of conduction system disease. ECG is relatively nonspecific.  2. Multivessel CAD status post CABG. He reports rare angina symptoms. Continue aspirin. Recent LDL was 60, he has not been on statin therapy long-term. We will obtain a follow-up echocardiogram to reassess LVEF particular and light of left pleural effusion documented during  hospital stay. Is on Lasix with potassium supplements.  3. History of essential hypertension, blood pressure is normal today.  4. History of postoperative atrial fibrillation in years past, no obvious recurrences.  Current medicines were reviewed with the patient today.   Orders Placed This Encounter  Procedures  . Cardiac event monitor  . ECHOCARDIOGRAM COMPLETE    Disposition: Follow-up in 6 months.  Signed, Jonelle Sidle, MD, [facility] 09/10/2017 3:06 PM    [facility] [address] [place], [place], [place] [address] Phone: [phone]; Fax: [phone]

## 2017-09-10 NOTE — Telephone Encounter (Signed)
ECHO PRECERT  Scheduled in [facility] on Sep 23, 2017

## 2017-09-10 NOTE — Patient Instructions (Signed)
Medication Instructions:  Your physician recommends that you continue on your current medications as directed. Please refer to the Current Medication list given to you today.  Labwork: NONE  Testing/Procedures: Your physician has requested that you have an echocardiogram. Echocardiography is a painless test that uses sound waves to create images of your heart. It provides your doctor with information about the size and shape of your heart and how well your heart's chambers and valves are working. This procedure takes approximately one hour. There are no restrictions for this procedure.  Your physician has recommended that you wear an event monitor. Event monitors are medical devices that record the heart's electrical activity. Doctors most often [place] these monitors to diagnose arrhythmias. Arrhythmias are problems with the speed or rhythm of the heartbeat. The monitor is a small, portable device. You can wear one while you do your normal daily activities. This is usually used to diagnose what is causing palpitations/syncope (passing out).   Follow-Up: Your physician wants you to follow-up in: 6 MONTHS WITH DR. MCDOWELL. You will receive a reminder letter in the mail two months in advance. If you don't receive a letter, please call our office to schedule the follow-up appointment.  Any Other Special Instructions Will Be Listed Below (If Applicable).  If you need a refill on your cardiac medications before your next appointment, please call your pharmacy.

## 2017-09-23 ENCOUNTER — Ambulatory Visit (INDEPENDENT_AMBULATORY_CARE_PROVIDER_SITE_OTHER): Payer: Medicare Other

## 2017-09-23 ENCOUNTER — Other Ambulatory Visit: Payer: Self-pay

## 2017-09-23 ENCOUNTER — Telehealth: Payer: Self-pay | Admitting: Cardiology

## 2017-09-23 DIAGNOSIS — I25119 Atherosclerotic heart disease of native coronary artery with unspecified angina pectoris: Secondary | ICD-10-CM

## 2017-09-23 NOTE — Telephone Encounter (Signed)
Son stated patient did receive monitor, but refuses to wear it. Son stated patient is very confused and doesn't understand why he needs to wear it. Attempted to explain to patient and patient stated it did not matter he was not wearing the monitor. Will let Dr. Diona BrownerMcDowell know.

## 2017-09-23 NOTE — Telephone Encounter (Signed)
Patient did not have heart monitor  Son wanted to let Dr Diona BrownerMcDowell know

## 2017-09-23 NOTE — Telephone Encounter (Signed)
Contacted [employer], preventice stated monitor was delivered and left on front porch on 10/10. Called patient to let them know LMTCB

## 2017-09-24 ENCOUNTER — Telehealth: Payer: Self-pay | Admitting: *Deleted

## 2017-09-24 NOTE — Telephone Encounter (Signed)
-----   Message from Jonelle SidleSamuel G McDowell, MD sent at 09/24/2017  7:06 AM EDT ----- Results reviewed. LVEF is normal in the range of 60-65%. A copy of this test should be forwarded to Joette CatchingNyland, Leonard, MD.

## 2017-09-24 NOTE — Telephone Encounter (Signed)
Patient informed. 

## 2018-02-10 ENCOUNTER — Ambulatory Visit (INDEPENDENT_AMBULATORY_CARE_PROVIDER_SITE_OTHER): Payer: Medicare Other | Admitting: Otolaryngology

## 2018-02-10 DIAGNOSIS — H6123 Impacted cerumen, bilateral: Secondary | ICD-10-CM | POA: Diagnosis not present

## 2018-02-10 DIAGNOSIS — H900 Conductive hearing loss, bilateral: Secondary | ICD-10-CM

## 2018-06-06 DEATH — deceased

## 2018-12-29 ENCOUNTER — Telehealth: Payer: Self-pay | Admitting: Cardiology

## 2018-12-29 NOTE — Telephone Encounter (Signed)
Numerous attempts to contact patient with recall letters. Unable to reach by telephone. with no success.   Anthony Page [[id number]] 09/10/2017 3:14 PM New [10]    [System] 12/07/2017 11:03 PM Notification Sent [20]   Anthony Page [[id number]] 09/15/2018 12:06 PM Notification Sent [20]   Anthony Page [[id number]] 12/29/2018 2:03 PM Notification Sent
